# Patient Record
Sex: Female | Born: 1965
Health system: Southern US, Community
[De-identification: ages and names within clinical notes are randomized; demographics above are authoritative.]

## PROBLEM LIST (undated history)

## (undated) DIAGNOSIS — E049 Nontoxic goiter, unspecified: Secondary | ICD-10-CM

## (undated) DIAGNOSIS — N951 Menopausal and female climacteric states: Secondary | ICD-10-CM

## (undated) DIAGNOSIS — R87629 Unspecified abnormal cytological findings in specimens from vagina: Secondary | ICD-10-CM

## (undated) DIAGNOSIS — R718 Other abnormality of red blood cells: Secondary | ICD-10-CM

## (undated) DIAGNOSIS — I10 Essential (primary) hypertension: Secondary | ICD-10-CM

## (undated) DIAGNOSIS — E559 Vitamin D deficiency, unspecified: Secondary | ICD-10-CM

## (undated) DIAGNOSIS — E785 Hyperlipidemia, unspecified: Secondary | ICD-10-CM

## (undated) HISTORY — DX: Vitamin D deficiency, unspecified: E55.9

## (undated) HISTORY — DX: Essential (primary) hypertension: I10

## (undated) HISTORY — DX: Menopausal and female climacteric states: N95.1

## (undated) HISTORY — DX: Unspecified abnormal cytological findings in specimens from vagina: R87.629

## (undated) HISTORY — DX: Hyperlipidemia, unspecified: E78.5

## (undated) HISTORY — DX: Nontoxic goiter, unspecified: E04.9

## (undated) HISTORY — DX: Other abnormality of red blood cells: R71.8

## (undated) HISTORY — PX: APPENDECTOMY: SHX54

## (undated) HISTORY — PX: ANKLE SURGERY: SHX546

---

## 1998-10-30 ENCOUNTER — Encounter: Payer: Self-pay | Admitting: Specialist

## 1998-10-30 ENCOUNTER — Inpatient Hospital Stay (HOSPITAL_COMMUNITY): Admission: EM | Admit: 1998-10-30 | Discharge: 1998-10-31 | Payer: Self-pay | Admitting: Emergency Medicine

## 1998-10-30 ENCOUNTER — Encounter: Payer: Self-pay | Admitting: Emergency Medicine

## 2002-07-02 HISTORY — PX: HYSTEROSCOPY W/ ENDOMETRIAL ABLATION: SUR665

## 2002-07-02 HISTORY — PX: OTHER SURGICAL HISTORY: SHX169

## 2002-10-26 ENCOUNTER — Encounter: Payer: Self-pay | Admitting: Obstetrics & Gynecology

## 2002-10-26 ENCOUNTER — Ambulatory Visit (HOSPITAL_COMMUNITY): Admission: RE | Admit: 2002-10-26 | Discharge: 2002-10-26 | Payer: Self-pay | Admitting: Obstetrics & Gynecology

## 2002-11-06 ENCOUNTER — Other Ambulatory Visit: Admission: RE | Admit: 2002-11-06 | Discharge: 2002-11-06 | Payer: Self-pay | Admitting: Obstetrics & Gynecology

## 2002-11-27 ENCOUNTER — Ambulatory Visit (HOSPITAL_COMMUNITY): Admission: RE | Admit: 2002-11-27 | Discharge: 2002-11-27 | Payer: Self-pay | Admitting: Obstetrics & Gynecology

## 2007-07-25 ENCOUNTER — Other Ambulatory Visit: Admission: RE | Admit: 2007-07-25 | Discharge: 2007-07-25 | Payer: Self-pay | Admitting: Obstetrics & Gynecology

## 2007-08-25 ENCOUNTER — Ambulatory Visit (HOSPITAL_COMMUNITY): Admission: RE | Admit: 2007-08-25 | Discharge: 2007-08-25 | Payer: Self-pay | Admitting: Obstetrics & Gynecology

## 2007-09-10 ENCOUNTER — Encounter: Admission: RE | Admit: 2007-09-10 | Discharge: 2007-09-10 | Payer: Self-pay | Admitting: Obstetrics & Gynecology

## 2008-09-14 ENCOUNTER — Other Ambulatory Visit: Admission: RE | Admit: 2008-09-14 | Discharge: 2008-09-14 | Payer: Self-pay | Admitting: Obstetrics & Gynecology

## 2008-09-14 ENCOUNTER — Encounter: Admission: RE | Admit: 2008-09-14 | Discharge: 2008-09-14 | Payer: Self-pay | Admitting: Obstetrics & Gynecology

## 2009-12-22 ENCOUNTER — Ambulatory Visit (HOSPITAL_COMMUNITY): Admission: RE | Admit: 2009-12-22 | Discharge: 2009-12-22 | Payer: Self-pay | Admitting: Obstetrics & Gynecology

## 2009-12-22 ENCOUNTER — Other Ambulatory Visit: Admission: RE | Admit: 2009-12-22 | Discharge: 2009-12-22 | Payer: Self-pay | Admitting: Obstetrics & Gynecology

## 2010-11-17 NOTE — Op Note (Signed)
   NAME:  Sherri Jackson, Sherri Jackson                      ACCOUNT NO.:  [id number]   MEDICAL RECORD NO.:  [id number]                   PATIENT TYPE:  AMB   LOCATION:  DAY                                  FACILITY:  [facility]   PHYSICIAN:  Lazaro Arms, M.D.                DATE OF BIRTH:  1966/06/04   DATE OF PROCEDURE:  11/27/2002  DATE OF DISCHARGE:                                 OPERATIVE REPORT   PREOPERATIVE DIAGNOSES:  1. Hypermenorrhea, dysmenorrhea.  2. High grade squamous intraepithelial lesion on Pap.   POSTOPERATIVE DIAGNOSES:  1. Hypermenorrhea, dysmenorrhea.  2. High grade squamous intraepithelial lesion on Pap.   PROCEDURE:  1. Hysteroscopy D&C with endometrial ablation.  2. Laser conization of cervix.   SURGEON:  Lazaro Arms, M.D.   ANESTHESIA:  General endotracheal anesthesia.   FINDINGS:  The patient had a normal endometrium, low endometrial fragments,  but no polyps, no fibroids seen.  A cervical colposcopy had been performed  in the office with biopsy but the lesion was in the cervical canal and we  decided to do a cold knife conization with ablation of the entire  endocervical canal.   DESCRIPTION OF OPERATION:  The patient was taken to the operating room and  placed in the supine position and underwent general endotracheal anesthesia;  placed in dorsal lithotomy position; prepped and draped in the usual sterile  fashion for vaginal surgery.  The bladder was drained.  Speculum was placed.  Cervix was grasped.  Pericervical block was placed.  Cervix was dilated  serially.  It sounded to 8 cm.  The hysteroscope was placed.  The above  noted findings seen.  Pictures were taken.  Curettage was performed; good  uterine cry in all areas.  Endometrial ablation using ThermaChoice balloon  was performed without difficulty.  Total therapy time of 8 minutes 52  seconds.  The balloon was removed.   The holmium laser was then used and a conization of the cervix was  performed.  The lesion I was concerned about was in the endocervical canal  and hemostasis was achieved with electrocautery and the holmium laser and  Lugol's solution.  The patient tolerated the procedure well.  She  experienced 100 cc of blood loss, taken to recovery in good stable  condition.  All counts were correct.                                               Lazaro Arms, M.D.   Loraine Maple  D:  11/27/2002  T:  11/27/2002  Job:  [id number]

## 2011-01-15 ENCOUNTER — Other Ambulatory Visit: Payer: Self-pay | Admitting: Obstetrics & Gynecology

## 2011-01-15 DIAGNOSIS — Z139 Encounter for screening, unspecified: Secondary | ICD-10-CM

## 2011-01-15 DIAGNOSIS — N63 Unspecified lump in unspecified breast: Secondary | ICD-10-CM

## 2011-01-22 ENCOUNTER — Other Ambulatory Visit (HOSPITAL_COMMUNITY)
Admission: RE | Admit: 2011-01-22 | Discharge: 2011-01-22 | Disposition: A | Discharge status: Home / Self Care | Payer: Federal, State, Local not specified - PPO | Source: Ambulatory Visit | Attending: Obstetrics & Gynecology | Admitting: Obstetrics & Gynecology

## 2011-01-22 ENCOUNTER — Other Ambulatory Visit: Payer: Self-pay | Admitting: Obstetrics & Gynecology

## 2011-01-22 DIAGNOSIS — Z01419 Encounter for gynecological examination (general) (routine) without abnormal findings: Secondary | ICD-10-CM | POA: Insufficient documentation

## 2011-01-23 ENCOUNTER — Other Ambulatory Visit: Payer: Self-pay | Admitting: Obstetrics & Gynecology

## 2011-01-23 ENCOUNTER — Ambulatory Visit
Admission: RE | Admit: 2011-01-23 | Discharge: 2011-01-23 | Disposition: A | Discharge status: Home / Self Care | Payer: Federal, State, Local not specified - PPO | Source: Ambulatory Visit | Attending: Obstetrics & Gynecology | Admitting: Obstetrics & Gynecology

## 2011-01-23 DIAGNOSIS — N63 Unspecified lump in unspecified breast: Secondary | ICD-10-CM

## 2011-02-26 ENCOUNTER — Ambulatory Visit (HOSPITAL_COMMUNITY): Payer: Self-pay

## 2012-01-16 ENCOUNTER — Other Ambulatory Visit: Payer: Self-pay | Admitting: Obstetrics & Gynecology

## 2012-01-16 DIAGNOSIS — Z1231 Encounter for screening mammogram for malignant neoplasm of breast: Secondary | ICD-10-CM

## 2012-04-10 ENCOUNTER — Ambulatory Visit: Payer: Federal, State, Local not specified - PPO

## 2012-04-14 ENCOUNTER — Other Ambulatory Visit (HOSPITAL_COMMUNITY)
Admission: RE | Admit: 2012-04-14 | Discharge: 2012-04-14 | Disposition: A | Discharge status: Home / Self Care | Payer: Federal, State, Local not specified - PPO | Source: Ambulatory Visit | Attending: Obstetrics & Gynecology | Admitting: Obstetrics & Gynecology

## 2012-04-14 ENCOUNTER — Ambulatory Visit
Admission: RE | Admit: 2012-04-14 | Discharge: 2012-04-14 | Disposition: A | Discharge status: Home / Self Care | Payer: Federal, State, Local not specified - PPO | Source: Ambulatory Visit | Attending: Obstetrics & Gynecology | Admitting: Obstetrics & Gynecology

## 2012-04-14 ENCOUNTER — Other Ambulatory Visit: Payer: Self-pay | Admitting: Obstetrics & Gynecology

## 2012-04-14 DIAGNOSIS — Z01419 Encounter for gynecological examination (general) (routine) without abnormal findings: Secondary | ICD-10-CM | POA: Insufficient documentation

## 2012-04-14 DIAGNOSIS — Z1231 Encounter for screening mammogram for malignant neoplasm of breast: Secondary | ICD-10-CM

## 2013-06-08 ENCOUNTER — Other Ambulatory Visit: Payer: Self-pay

## 2013-06-08 DIAGNOSIS — Z1231 Encounter for screening mammogram for malignant neoplasm of breast: Secondary | ICD-10-CM

## 2013-07-06 ENCOUNTER — Ambulatory Visit
Admission: RE | Admit: 2013-07-06 | Discharge: 2013-07-06 | Disposition: A | Discharge status: Home / Self Care | Payer: Federal, State, Local not specified - PPO | Source: Ambulatory Visit

## 2013-07-06 DIAGNOSIS — Z1231 Encounter for screening mammogram for malignant neoplasm of breast: Secondary | ICD-10-CM

## 2013-12-01 ENCOUNTER — Ambulatory Visit (INDEPENDENT_AMBULATORY_CARE_PROVIDER_SITE_OTHER): Payer: Federal, State, Local not specified - PPO | Admitting: Adult Health

## 2013-12-01 ENCOUNTER — Other Ambulatory Visit: Payer: Federal, State, Local not specified - PPO

## 2013-12-01 ENCOUNTER — Encounter: Payer: Self-pay | Admitting: Adult Health

## 2013-12-01 ENCOUNTER — Other Ambulatory Visit (HOSPITAL_COMMUNITY)
Admission: RE | Admit: 2013-12-01 | Discharge: 2013-12-01 | Disposition: A | Discharge status: Home / Self Care | Payer: Federal, State, Local not specified - PPO | Source: Ambulatory Visit | Attending: Adult Health | Admitting: Adult Health

## 2013-12-01 VITALS — BP 106/60 | HR 74 | Ht 59.0 in | Wt 87.0 lb

## 2013-12-01 DIAGNOSIS — Z01419 Encounter for gynecological examination (general) (routine) without abnormal findings: Secondary | ICD-10-CM | POA: Insufficient documentation

## 2013-12-01 DIAGNOSIS — Z1151 Encounter for screening for human papillomavirus (HPV): Secondary | ICD-10-CM | POA: Insufficient documentation

## 2013-12-01 DIAGNOSIS — E049 Nontoxic goiter, unspecified: Secondary | ICD-10-CM | POA: Insufficient documentation

## 2013-12-01 DIAGNOSIS — Z1212 Encounter for screening for malignant neoplasm of rectum: Secondary | ICD-10-CM

## 2013-12-01 DIAGNOSIS — N951 Menopausal and female climacteric states: Secondary | ICD-10-CM

## 2013-12-01 HISTORY — DX: Menopausal and female climacteric states: N95.1

## 2013-12-01 HISTORY — DX: Nontoxic goiter, unspecified: E04.9

## 2013-12-01 LAB — HEMOCCULT GUIAC POC 1CARD (OFFICE): FECAL OCCULT BLD: NEGATIVE

## 2013-12-01 NOTE — Patient Instructions (Signed)
Menopause Menopause is the normal time of life when menstrual periods stop completely. Menopause is complete when you have missed 12 consecutive menstrual periods. It usually occurs between the ages of 62 years and 17 years. Very rarely does a woman develop menopause before the age of 79 years. At menopause, your ovaries stop producing the female hormones estrogen and progesterone. This can cause undesirable symptoms and also affect your health. Sometimes the symptoms may occur 4 5 years before the menopause begins. There is no relationship between menopause and: Oral contraceptives. Number of children you had. Race. The age your menstrual periods started (menarche). Heavy smokers and very thin women may develop menopause earlier in life. CAUSES The ovaries stop producing the female hormones estrogen and progesterone. Other causes include: Surgery to remove both ovaries. The ovaries stop functioning for no known reason. Tumors of the pituitary gland in the brain. Medical disease that affects the ovaries and hormone production. Radiation treatment to the abdomen or pelvis. Chemotherapy that affects the ovaries. SYMPTOMS  Hot flashes. Night sweats. Decrease in sex drive. Vaginal dryness and thinning of the vagina causing painful intercourse. Dryness of the skin and developing wrinkles. Headaches. Tiredness. Irritability. Memory problems. Weight gain. Bladder infections. Hair growth of the face and chest. Infertility. More serious symptoms include: Loss of bone (osteoporosis) causing breaks (fractures). Depression. Hardening and narrowing of the arteries (atherosclerosis) causing heart attacks and strokes. DIAGNOSIS  When the menstrual periods have stopped for 12 straight months. Physical exam. Hormone studies of the blood. TREATMENT  There are many treatment choices and nearly as many questions about them. The decisions to treat or not to treat menopausal changes is an individual  choice made with your health care provider. Your health care provider can discuss the treatments with you. Together, you can decide which treatment will work best for you. Your treatment choices may include:  Hormone therapy (estrogen and progesterone). Non-hormonal medicines. Treating the individual symptoms with medicine (for example antidepressants for depression). Herbal medicines that may help specific symptoms. Counseling by a psychiatrist or psychologist. Group therapy. Lifestyle changes including: Eating healthy. Regular exercise. Limiting caffeine and alcohol. Stress management and meditation. No treatment. HOME CARE INSTRUCTIONS  Take the medicine your health care provider gives you as directed. Get plenty of sleep and rest. Exercise regularly. Eat a diet that contains calcium (good for the bones) and soy products (acts like estrogen hormone). Avoid alcoholic beverages. Do not smoke. If you have hot flashes, dress in layers. Take supplements, calcium, and vitamin D to strengthen bones. You can use over-the-counter lubricants or moisturizers for vaginal dryness. Group therapy is sometimes very helpful. Acupuncture may be helpful in some cases. SEEK MEDICAL CARE IF:  You are not sure you are in menopause. You are having menopausal symptoms and need advice and treatment. You are still having menstrual periods after age 54 years. You have pain with intercourse. Menopause is complete (no menstrual period for 12 months) and you develop vaginal bleeding. You need a referral to a specialist (gynecologist, psychiatrist, or psychologist) for treatment. SEEK IMMEDIATE MEDICAL CARE IF:  You have severe depression. You have excessive vaginal bleeding. You fell and think you have a broken bone. You have pain when you urinate. You develop leg or chest pain. You have a fast pounding heart beat (palpitations). You have severe headaches. You develop vision problems. You feel a lump  in your breast. You have abdominal pain or severe indigestion. Document Released: 09/08/2003 Document Revised: 02/18/2013 Document Reviewed: 01/15/2013 [employer]Care  Patient Information 2014 [place]. Perimenopause Perimenopause is the time when your body begins to move into the menopause (no menstrual period for 12 straight months). It is a natural process. Perimenopause can begin 2 8 years before the menopause and usually lasts for 1 year after the menopause. During this time, your ovaries may or may not produce an egg. The ovaries vary in their production of estrogen and progesterone hormones each month. This can cause irregular menstrual periods, difficulty getting pregnant, vaginal bleeding between periods, and uncomfortable symptoms. CAUSES  Irregular production of the ovarian hormones, estrogen and progesterone, and not ovulating every month.  Other causes include:  Tumor of the pituitary gland in the brain.  Medical disease that affects the ovaries.  Radiation treatment.  Chemotherapy.  Unknown causes.  Heavy smoking and excessive alcohol intake can bring on perimenopause sooner. SIGNS AND SYMPTOMS   Hot flashes.  Night sweats.  Irregular menstrual periods.  Decreased sex drive.  Vaginal dryness.  Headaches.  Mood swings.  Depression.  Memory problems.  Irritability.  Tiredness.  Weight gain.  Trouble getting pregnant.  The beginning of losing bone cells (osteoporosis).  The beginning of hardening of the arteries (atherosclerosis). DIAGNOSIS  Your health care provider will make a diagnosis by analyzing your age, menstrual history, and symptoms. He or she will do a physical exam and note any changes in your body, especially your female organs. Female hormone tests may or may not be helpful depending on the amount of female hormones you produce and when you produce them. However, other hormone tests may be helpful to rule out other problems. TREATMENT   In some cases, no treatment is needed. The decision on whether treatment is necessary during the perimenopause should be made by you and your health care provider based on how the symptoms are affecting you and your lifestyle. Various treatments are available, such as:  Treating individual symptoms with a specific medicine for that symptom.  Herbal medicines that can help specific symptoms.  Counseling.  Group therapy. HOME CARE INSTRUCTIONS   Keep track of your menstrual periods (when they occur, how heavy they are, how long between periods, and how long they last) as well as your symptoms and when they started.  Only take over-the-counter or prescription medicines as directed by your health care provider.  Sleep and rest.  Exercise.  Eat a diet that contains calcium (good for your bones) and soy (acts like the estrogen hormone).  Do not smoke.  Avoid alcoholic beverages.  Take vitamin supplements as recommended by your health care provider. Taking vitamin E may help in certain cases.  Take calcium and vitamin D supplements to help prevent bone loss.  Group therapy is sometimes helpful.  Acupuncture may help in some cases. SEEK MEDICAL CARE IF:   You have questions about any symptoms you are having.  You need a referral to a specialist (gynecologist, psychiatrist, or psychologist). SEEK IMMEDIATE MEDICAL CARE IF:   You have vaginal bleeding.  Your period lasts longer than 8 days.  Your periods are recurring sooner than 21 days.  You have bleeding after intercourse.  You have severe depression.  You have pain when you urinate.  You have severe headaches.  You have vision problems. Document Released: 07/26/2004 Document Revised: 04/08/2013 Document Reviewed: 01/15/2013 [facility] Patient Information 2014 [place], [place]. Physical in 1 year Mammogram yearly  Thyroid US 6/3 at 4:15 pm be there at 4 pm Colonoscopy at 5

## 2013-12-01 NOTE — Progress Notes (Signed)
Patient ID: Sherri Jackson, female   DOB: 1965-08-10, 48 y.o.   MRN: [id number] History of Present Illness: Sherri Jackson is a 48 year old white female, married in for a pap and physical.   Current Medications, Allergies, Past Medical History, Past Surgical History, Family History and Social History were reviewed in [employer] record.     Review of Systems: Patient denies any headaches, blurred vision, shortness of breath, chest pain, abdominal pain, problems with bowel movements, urination, or intercourse. No joint pain, she is having moodiness,and she is skipping periods,no hot flashes during day but hot at night.    Physical Exam:BP 106/60  Pulse 74  Ht 4\' 11"  (1.499 m)  Wt 87 lb (39.463 kg)  BMI 17.56 kg/m2  LMP 11/15/2013 General:  Well developed, well nourished, no acute distress Skin:  Warm and dry Neck:  Midline trachea,  Thyroid enlarged R>L Lungs; Clear to auscultation bilaterally Breast:  No dominant palpable mass, retraction, or nipple discharge Cardiovascular: Regular rate and rhythm Abdomen:  Soft, non tender, no hepatosplenomegaly Pelvic:  External genitalia is normal in appearance.  The vagina is normal in appearance.  The cervix is nulliparous, Pap with HPV performed.  Uterus is felt to be normal size, shape, and contour.  No                adnexal masses or tenderness noted. Rectal: Good sphincter tone, no polyps, or hemorrhoids felt.  Hemoccult negative. Extremities:  No swelling or varicosities noted Psych:  Alert and cooperative,seems happy Discussed menopause and some treatment options.  Impression: Yearly gyn exam Peri menopause Enlarged thyroid    Plan: Check CBC,CMP,TSH,FSH and lipids Thyroid US 6/3 at 4:15 pm at [facility] Physical in 1 year Mammogram yearly  Colonoscopy at 48 (had one in 2008 for bleeding due to hemorrhoids) Review handout on peri menopause and menopause  Will talk when labs back or can look at my chart

## 2013-12-02 ENCOUNTER — Ambulatory Visit (HOSPITAL_COMMUNITY)
Admission: RE | Admit: 2013-12-02 | Discharge: 2013-12-02 | Disposition: A | Discharge status: Home / Self Care | Payer: Federal, State, Local not specified - PPO | Source: Ambulatory Visit | Attending: Adult Health | Admitting: Adult Health

## 2013-12-02 DIAGNOSIS — E049 Nontoxic goiter, unspecified: Secondary | ICD-10-CM

## 2013-12-02 LAB — LIPID PANEL
CHOL/HDL RATIO: 3.1 ratio
Cholesterol: 178 mg/dL (ref 0–200)
HDL: 57 mg/dL (ref >39)
LDL CALC: 108 mg/dL — AB (ref 0–99)
TRIGLYCERIDES: 66 mg/dL (ref <150)
VLDL: 13 mg/dL (ref 0–40)

## 2013-12-02 LAB — COMPREHENSIVE METABOLIC PANEL
ALBUMIN: 4.5 g/dL (ref 3.5–5.2)
ALK PHOS: 45 U/L (ref 39–117)
ALT: 12 U/L (ref 0–35)
AST: 16 U/L (ref 0–37)
BUN: 10 mg/dL (ref 6–23)
CO2: 25 mEq/L (ref 19–32)
Calcium: 9.8 mg/dL (ref 8.4–10.5)
Chloride: 106 mEq/L (ref 96–112)
Creat: 0.49 mg/dL — ABNORMAL LOW (ref 0.50–1.10)
Glucose, Bld: 83 mg/dL (ref 70–99)
POTASSIUM: 4.8 meq/L (ref 3.5–5.3)
SODIUM: 139 meq/L (ref 135–145)
TOTAL PROTEIN: 6.7 g/dL (ref 6.0–8.3)
Total Bilirubin: 0.4 mg/dL (ref 0.2–1.2)

## 2013-12-02 LAB — FOLLICLE STIMULATING HORMONE: FSH: 7.6 m[IU]/mL

## 2013-12-02 LAB — CBC
HCT: 39.5 % (ref 36.0–46.0)
Hemoglobin: 13.7 g/dL (ref 12.0–15.0)
MCH: 34 pg (ref 26.0–34.0)
MCHC: 34.7 g/dL (ref 30.0–36.0)
MCV: 98 fL (ref 78.0–100.0)
Platelets: 510 10*3/uL — ABNORMAL HIGH (ref 150–400)
RBC: 4.03 MIL/uL (ref 3.87–5.11)
RDW: 12.8 % (ref 11.5–15.5)
WBC: 9.4 10*3/uL (ref 4.0–10.5)

## 2013-12-02 LAB — TSH: TSH: 0.633 u[IU]/mL (ref 0.350–4.500)

## 2013-12-04 LAB — CYTOLOGY - PAP

## 2013-12-07 ENCOUNTER — Telehealth: Payer: Self-pay | Admitting: Adult Health

## 2013-12-07 NOTE — Telephone Encounter (Signed)
Pt aware of labs,thyroid US and Pap

## 2013-12-10 ENCOUNTER — Encounter: Payer: Self-pay | Admitting: Adult Health

## 2014-07-09 ENCOUNTER — Other Ambulatory Visit: Payer: Self-pay

## 2014-07-09 DIAGNOSIS — Z1231 Encounter for screening mammogram for malignant neoplasm of breast: Secondary | ICD-10-CM

## 2014-08-04 ENCOUNTER — Ambulatory Visit
Admission: RE | Admit: 2014-08-04 | Discharge: 2014-08-04 | Disposition: A | Discharge status: Home / Self Care | Payer: Federal, State, Local not specified - PPO | Source: Ambulatory Visit

## 2014-08-04 ENCOUNTER — Other Ambulatory Visit: Payer: Self-pay

## 2014-08-04 DIAGNOSIS — Z1231 Encounter for screening mammogram for malignant neoplasm of breast: Secondary | ICD-10-CM

## 2015-07-06 ENCOUNTER — Emergency Department (INDEPENDENT_AMBULATORY_CARE_PROVIDER_SITE_OTHER): Payer: Federal, State, Local not specified - PPO

## 2015-07-06 ENCOUNTER — Emergency Department (INDEPENDENT_AMBULATORY_CARE_PROVIDER_SITE_OTHER)
Admission: EM | Admit: 2015-07-06 | Discharge: 2015-07-06 | Disposition: A | Discharge status: Home / Self Care | Payer: Federal, State, Local not specified - PPO | Source: Home / Self Care | Attending: Family Medicine | Admitting: Family Medicine

## 2015-07-06 ENCOUNTER — Encounter (HOSPITAL_COMMUNITY): Payer: Self-pay | Admitting: Emergency Medicine

## 2015-07-06 DIAGNOSIS — M542 Cervicalgia: Secondary | ICD-10-CM

## 2015-07-06 NOTE — Discharge Instructions (Signed)
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
## 2015-08-29 ENCOUNTER — Other Ambulatory Visit: Payer: Self-pay

## 2015-08-29 DIAGNOSIS — Z1231 Encounter for screening mammogram for malignant neoplasm of breast: Secondary | ICD-10-CM

## 2015-09-16 ENCOUNTER — Ambulatory Visit
Admission: RE | Admit: 2015-09-16 | Discharge: 2015-09-16 | Disposition: A | Discharge status: Home / Self Care | Payer: Federal, State, Local not specified - PPO | Source: Ambulatory Visit

## 2015-09-16 DIAGNOSIS — Z1231 Encounter for screening mammogram for malignant neoplasm of breast: Secondary | ICD-10-CM

## 2015-09-20 ENCOUNTER — Other Ambulatory Visit: Payer: Self-pay | Admitting: Obstetrics & Gynecology

## 2015-09-20 DIAGNOSIS — R928 Other abnormal and inconclusive findings on diagnostic imaging of breast: Secondary | ICD-10-CM

## 2015-09-27 ENCOUNTER — Ambulatory Visit
Admission: RE | Admit: 2015-09-27 | Discharge: 2015-09-27 | Disposition: A | Discharge status: Home / Self Care | Payer: Federal, State, Local not specified - PPO | Source: Ambulatory Visit | Attending: Obstetrics & Gynecology | Admitting: Obstetrics & Gynecology

## 2015-09-27 DIAGNOSIS — R928 Other abnormal and inconclusive findings on diagnostic imaging of breast: Secondary | ICD-10-CM

## 2016-03-19 ENCOUNTER — Encounter: Payer: Self-pay | Admitting: Adult Health

## 2016-03-19 ENCOUNTER — Ambulatory Visit (INDEPENDENT_AMBULATORY_CARE_PROVIDER_SITE_OTHER): Payer: Federal, State, Local not specified - PPO | Admitting: Adult Health

## 2016-03-19 VITALS — BP 122/70 | HR 66 | Ht 59.25 in | Wt 88.0 lb

## 2016-03-19 DIAGNOSIS — F4329 Adjustment disorder with other symptoms: Secondary | ICD-10-CM | POA: Diagnosis not present

## 2016-03-19 DIAGNOSIS — R232 Flushing: Secondary | ICD-10-CM

## 2016-03-19 DIAGNOSIS — N951 Menopausal and female climacteric states: Secondary | ICD-10-CM

## 2016-03-19 DIAGNOSIS — K649 Unspecified hemorrhoids: Secondary | ICD-10-CM

## 2016-03-19 DIAGNOSIS — Z01411 Encounter for gynecological examination (general) (routine) with abnormal findings: Secondary | ICD-10-CM | POA: Diagnosis not present

## 2016-03-19 DIAGNOSIS — Z1211 Encounter for screening for malignant neoplasm of colon: Secondary | ICD-10-CM | POA: Diagnosis not present

## 2016-03-19 DIAGNOSIS — Z780 Asymptomatic menopausal state: Secondary | ICD-10-CM | POA: Diagnosis not present

## 2016-03-19 DIAGNOSIS — Z01419 Encounter for gynecological examination (general) (routine) without abnormal findings: Secondary | ICD-10-CM

## 2016-03-19 LAB — HEMOCCULT GUIAC POC 1CARD (OFFICE): FECAL OCCULT BLD: NEGATIVE

## 2016-03-19 MED ORDER — LORAZEPAM 0.5 MG PO TABS: ORAL_TABLET | ORAL | 0 refills | Status: DC

## 2016-03-19 NOTE — Patient Instructions (Addendum)
Pap and physical in 1 year Mammogram yearly Colonoscopy at 33 See dermatologist

## 2016-03-19 NOTE — Progress Notes (Signed)
Patient ID: Sherri Jackson, female   DOB: 01-20-1966, 50 y.o.   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   Current Medications, Allergies, Past Medical History, Past Surgical History, Family History and Social History were reviewed in [employer] record.     Review of Systems: Patient denies any headaches, hearing loss, fatigue, blurred vision, shortness of breath, chest pain, abdominal pain, problems with urination, or intercourse. No joint pain or mood swings. See HPI for positives.   Physical Exam:BP 122/70 (BP Location: Left Arm, Patient Position: Sitting, Cuff Size: Normal)   Pulse 66   Ht 4' 11.25" (1.505 m)   Wt 88 lb (39.9 kg)   LMP 11/15/2013   BMI 17.62 kg/m  General:  Well developed, well nourished, no acute distress Skin:  Warm and dry Neck:  Midline trachea, normal thyroid, good ROM, no lymphadenopathy Lungs; Clear to auscultation bilaterally Breast:  No dominant palpable mass, retraction, or nipple discharge Cardiovascular: Regular rate and rhythm Back: has several moles in places of friction but not of concern,see dermatologist  Abdomen:  Soft, non tender, no hepatosplenomegaly Pelvic:  External genitalia is normal in appearance, no lesions.  The vagina is normal in appearance. Urethra has no lesions or masses. The cervix is smooth.  Uterus is felt to be normal size, shape, and contour.  No adnexal masses or tenderness noted.Bladder is non tender, no masses felt. Rectal: Good sphincter tone, no polyps, + hemorrhoids felt.   Hemoccult negative. Extremities/musculoskeletal:  No swelling or varicosities noted, no clubbing or cyanosis Psych:  No mood changes, alert and cooperative,seems happy Discussed if has any bleeding let me know, and she says does not need meds for hot flashes, but would take something to help when feeling really stressed.   Impression: 1. Well woman exam with routine gynecological exam   2. Hemorrhoids, unspecified hemorrhoid type   3. Hot flashes   4. Postmenopausal   5. Stress and adjustment reaction       Plan: Check CBC,CMP,TSH and lipids,A1c and vitamin D Rx ativan 0.5 mg #30 take 1 every 12 hours prn, no refills  Pap and physical in 1 year Mammogram yearly  Colonoscopy at 55 See dermatologist

## 2016-03-20 ENCOUNTER — Encounter: Payer: Self-pay | Admitting: Adult Health

## 2016-03-20 ENCOUNTER — Telehealth: Payer: Self-pay | Admitting: Adult Health

## 2016-03-20 DIAGNOSIS — R718 Other abnormality of red blood cells: Secondary | ICD-10-CM | POA: Insufficient documentation

## 2016-03-20 DIAGNOSIS — E559 Vitamin D deficiency, unspecified: Secondary | ICD-10-CM

## 2016-03-20 HISTORY — DX: Vitamin D deficiency, unspecified: E55.9

## 2016-03-20 HISTORY — DX: Other abnormality of red blood cells: R71.8

## 2016-03-20 LAB — COMPREHENSIVE METABOLIC PANEL
A/G RATIO: 1.9 (ref 1.2–2.2)
ALBUMIN: 4.6 g/dL (ref 3.5–5.5)
ALK PHOS: 56 IU/L (ref 39–117)
ALT: 9 IU/L (ref 0–32)
AST: 17 IU/L (ref 0–40)
BILIRUBIN TOTAL: 0.2 mg/dL (ref 0.0–1.2)
BUN/Creatinine Ratio: 14 (ref 9–23)
BUN: 8 mg/dL (ref 6–24)
CHLORIDE: 101 mmol/L (ref 96–106)
CO2: 22 mmol/L (ref 18–29)
Calcium: 10.3 mg/dL — ABNORMAL HIGH (ref 8.7–10.2)
Creatinine, Ser: 0.57 mg/dL (ref 0.57–1.00)
GFR calc non Af Amer: 109 mL/min/{1.73_m2} (ref >59)
GFR, EST AFRICAN AMERICAN: 126 mL/min/{1.73_m2} (ref >59)
Globulin, Total: 2.4 g/dL (ref 1.5–4.5)
Glucose: 79 mg/dL (ref 65–99)
POTASSIUM: 5 mmol/L (ref 3.5–5.2)
Sodium: 142 mmol/L (ref 134–144)
Total Protein: 7 g/dL (ref 6.0–8.5)

## 2016-03-20 LAB — LIPID PANEL
CHOL/HDL RATIO: 2.4 ratio (ref 0.0–4.4)
CHOLESTEROL TOTAL: 196 mg/dL (ref 100–199)
HDL: 83 mg/dL (ref >39)
LDL Calculated: 95 mg/dL (ref 0–99)
TRIGLYCERIDES: 92 mg/dL (ref 0–149)
VLDL Cholesterol Cal: 18 mg/dL (ref 5–40)

## 2016-03-20 LAB — CBC
HEMATOCRIT: 40.7 % (ref 34.0–46.6)
HEMOGLOBIN: 13.8 g/dL (ref 11.1–15.9)
MCH: 34.4 pg — AB (ref 26.6–33.0)
MCHC: 33.9 g/dL (ref 31.5–35.7)
MCV: 102 fL — AB (ref 79–97)
Platelets: 523 10*3/uL — ABNORMAL HIGH (ref 150–379)
RBC: 4.01 x10E6/uL (ref 3.77–5.28)
RDW: 13.5 % (ref 12.3–15.4)
WBC: 11.1 10*3/uL — ABNORMAL HIGH (ref 3.4–10.8)

## 2016-03-20 LAB — TSH: TSH: 0.605 u[IU]/mL (ref 0.450–4.500)

## 2016-03-20 LAB — SPECIMEN STATUS REPORT

## 2016-03-20 LAB — VITAMIN D 25 HYDROXY (VIT D DEFICIENCY, FRACTURES): VIT D 25 HYDROXY: 23.1 ng/mL — AB (ref 30.0–100.0)

## 2016-03-20 LAB — HEMOGLOBIN A1C
Est. average glucose Bld gHb Est-mCnc: 103 mg/dL
Hgb A1c MFr Bld: 5.2 % (ref 4.8–5.6)

## 2016-03-20 MED ORDER — CHOLECALCIFEROL 125 MCG (5000 UT) PO CAPS: 5000.0000 [IU] | ORAL_CAPSULE | Freq: Every day | ORAL | Status: DC

## 2016-03-20 NOTE — Telephone Encounter (Signed)
Left message that MCV was elevated will check B12 and folate, and that vitamin D level low, so take vitamin D 3 5000 IU daily

## 2016-03-21 ENCOUNTER — Telehealth: Payer: Self-pay | Admitting: Adult Health

## 2016-03-21 LAB — SPECIMEN STATUS REPORT

## 2016-03-21 LAB — B12 AND FOLATE PANEL
FOLATE: 10.4 ng/mL (ref >3.0)
VITAMIN B 12: 327 pg/mL (ref 211–946)

## 2016-03-21 NOTE — Telephone Encounter (Signed)
Pt aware labs WNL, can still try B 12 OTC daily po to see if helps with fatigue feeling

## 2016-06-27 ENCOUNTER — Other Ambulatory Visit: Payer: Self-pay | Admitting: Adult Health

## 2016-06-27 MED ORDER — CHOLECALCIFEROL 125 MCG (5000 UT) PO CAPS: 5000.0000 [IU] | ORAL_CAPSULE | Freq: Every day | ORAL | Status: DC

## 2016-06-27 MED ORDER — LORAZEPAM 0.5 MG PO TABS: ORAL_TABLET | ORAL | 0 refills | Status: DC

## 2016-06-27 NOTE — Telephone Encounter (Signed)
Will refill ativan and vitam in D

## 2016-08-16 DIAGNOSIS — L298 Other pruritus: Secondary | ICD-10-CM | POA: Diagnosis not present

## 2016-08-16 DIAGNOSIS — D225 Melanocytic nevi of trunk: Secondary | ICD-10-CM | POA: Diagnosis not present

## 2016-08-16 DIAGNOSIS — L820 Inflamed seborrheic keratosis: Secondary | ICD-10-CM | POA: Diagnosis not present

## 2016-08-22 DIAGNOSIS — K080 Exfoliation of teeth due to systemic causes: Secondary | ICD-10-CM | POA: Diagnosis not present

## 2016-10-12 ENCOUNTER — Other Ambulatory Visit: Payer: Self-pay | Admitting: Adult Health

## 2016-10-12 ENCOUNTER — Encounter: Payer: Self-pay | Admitting: Adult Health

## 2016-10-12 DIAGNOSIS — Z1231 Encounter for screening mammogram for malignant neoplasm of breast: Secondary | ICD-10-CM

## 2016-10-15 ENCOUNTER — Other Ambulatory Visit: Payer: Self-pay | Admitting: Adult Health

## 2016-10-15 MED ORDER — LORAZEPAM 0.5 MG PO TABS: ORAL_TABLET | ORAL | 0 refills | Status: DC

## 2016-10-15 NOTE — Telephone Encounter (Signed)
Refilled ativan

## 2016-11-01 ENCOUNTER — Ambulatory Visit
Admission: RE | Admit: 2016-11-01 | Discharge: 2016-11-01 | Disposition: A | Discharge status: Home / Self Care | Payer: Federal, State, Local not specified - PPO | Source: Ambulatory Visit | Attending: Adult Health | Admitting: Adult Health

## 2016-11-01 DIAGNOSIS — Z1231 Encounter for screening mammogram for malignant neoplasm of breast: Secondary | ICD-10-CM

## 2016-12-12 DIAGNOSIS — R197 Diarrhea, unspecified: Secondary | ICD-10-CM | POA: Diagnosis not present

## 2016-12-12 DIAGNOSIS — Z1211 Encounter for screening for malignant neoplasm of colon: Secondary | ICD-10-CM | POA: Diagnosis not present

## 2017-01-01 DIAGNOSIS — Z1211 Encounter for screening for malignant neoplasm of colon: Secondary | ICD-10-CM | POA: Diagnosis not present

## 2017-02-26 DIAGNOSIS — K080 Exfoliation of teeth due to systemic causes: Secondary | ICD-10-CM | POA: Diagnosis not present

## 2017-04-08 ENCOUNTER — Ambulatory Visit (INDEPENDENT_AMBULATORY_CARE_PROVIDER_SITE_OTHER): Payer: Federal, State, Local not specified - PPO | Admitting: Adult Health

## 2017-04-08 ENCOUNTER — Encounter: Payer: Self-pay | Admitting: Adult Health

## 2017-04-08 ENCOUNTER — Other Ambulatory Visit (HOSPITAL_COMMUNITY)
Admission: RE | Admit: 2017-04-08 | Discharge: 2017-04-08 | Disposition: A | Discharge status: Home / Self Care | Payer: Federal, State, Local not specified - PPO | Source: Ambulatory Visit | Attending: Adult Health | Admitting: Adult Health

## 2017-04-08 VITALS — BP 110/62 | HR 78 | Resp 18 | Ht 59.5 in | Wt 87.0 lb

## 2017-04-08 DIAGNOSIS — Z01411 Encounter for gynecological examination (general) (routine) with abnormal findings: Secondary | ICD-10-CM

## 2017-04-08 DIAGNOSIS — F4329 Adjustment disorder with other symptoms: Secondary | ICD-10-CM

## 2017-04-08 DIAGNOSIS — Z01419 Encounter for gynecological examination (general) (routine) without abnormal findings: Secondary | ICD-10-CM | POA: Insufficient documentation

## 2017-04-08 DIAGNOSIS — Z1212 Encounter for screening for malignant neoplasm of rectum: Secondary | ICD-10-CM

## 2017-04-08 DIAGNOSIS — N950 Postmenopausal bleeding: Secondary | ICD-10-CM

## 2017-04-08 DIAGNOSIS — E559 Vitamin D deficiency, unspecified: Secondary | ICD-10-CM

## 2017-04-08 DIAGNOSIS — Z1211 Encounter for screening for malignant neoplasm of colon: Secondary | ICD-10-CM | POA: Diagnosis not present

## 2017-04-08 DIAGNOSIS — K580 Irritable bowel syndrome with diarrhea: Secondary | ICD-10-CM

## 2017-04-08 LAB — HEMOCCULT GUIAC POC 1CARD (OFFICE): Fecal Occult Blood, POC: NEGATIVE

## 2017-04-08 MED ORDER — LORAZEPAM 0.5 MG PO TABS: ORAL_TABLET | ORAL | 0 refills | Status: DC

## 2017-04-08 MED ORDER — DICYCLOMINE HCL 10 MG PO CAPS: 10.0000 mg | ORAL_CAPSULE | Freq: Three times a day (TID) | ORAL | 1 refills | Status: DC

## 2017-04-08 NOTE — Progress Notes (Signed)
Patient ID: Sherri Jackson, female   DOB: [birth year], 51 y.o.   MRN: [id number] History of Present Illness: Sherri Jackson is a 51 year old white female in for well woman gyn exam and pap.She had bleeding in April 2018, and period was over a year before that.Stil has stress.   Current Medications, Allergies, Past Medical History, Past Surgical History, Family History and Social History were reviewed in [employer] record.     Review of Systems:  Patient denies any headaches, hearing loss, fatigue, blurred vision, shortness of breath, chest pain, abdominal pain, problems with  urination, or intercourse. No joint pain or mood swings.Has bloating and diarrhea after eating.Had normal colonoscopy in July 2018.   Physical Exam:BP 110/62   Pulse 78   Resp 18   Ht 4' 11.5" (1.511 m)   Wt 87 lb (39.5 kg)   LMP 11/15/2013 Comment: bleeding 09/2016  SpO2 99%   BMI 17.28 kg/m  General:  Well developed, well nourished, no acute distress Skin:  Warm and dry Neck:  Midline trachea, normal thyroid, good ROM, no lymphadenopathy Lungs; Clear to auscultation bilaterally Breast:  No dominant palpable mass, retraction, or nipple discharge Cardiovascular: Regular rate and rhythm Abdomen:  Soft, non tender, no hepatosplenomegaly Pelvic:  External genitalia is normal in appearance, no lesions.  The vagina is normal in appearance. Urethra has no lesions or masses. The cervix is smooth, pap with HPV performed.  Uterus is felt to be normal size, shape, and contour.  No adnexal masses or tenderness noted.Bladder is non tender, no masses felt. Rectal: Good sphincter tone, no polyps, or hemorrhoids felt.  Hemoccult negative. Extremities/musculoskeletal:  No swelling or varicosities noted, no clubbing or cyanosis Psych:  No mood changes, alert and cooperative,seems happy PHQ 2 score 0.  Impression: 1. Well woman exam with routine gynecological exam   2. PMB (postmenopausal bleeding)   3.  Screening for colorectal cancer   4. Vitamin D deficiency   5. Stress and adjustment reaction   6. Irritable bowel syndrome with diarrhea       Plan: Meds ordered this encounter  Medications  . dicyclomine (BENTYL) 10 MG capsule    Sig: Take 1 capsule (10 mg total) by mouth 3 (three) times daily before meals.    Dispense:  90 capsule    Refill:  1    Order Specific Question:   Supervising Provider    Answer:   Elonda Husky, LUTHER H [[id number]]  . LORazepam (ATIVAN) 0.5 MG tablet    Sig: Take 1 every 12 hours as needed    Dispense:  30 tablet    Refill:  0    Order Specific Question:   Supervising Provider    Answer:   Tania Ade H [[id number]]  Check CBC,CMP,TSH and lipids,Vitamin D and B12 GYN [place] in 1 week to assess PMB Physical in 1 year Pap in 3 if normal

## 2017-04-09 LAB — COMPREHENSIVE METABOLIC PANEL
A/G RATIO: 2.3 — AB (ref 1.2–2.2)
ALBUMIN: 4.8 g/dL (ref 3.5–5.5)
ALT: 15 IU/L (ref 0–32)
AST: 19 IU/L (ref 0–40)
Alkaline Phosphatase: 50 IU/L (ref 39–117)
BUN / CREAT RATIO: 18 (ref 9–23)
BUN: 9 mg/dL (ref 6–24)
Bilirubin Total: 0.2 mg/dL (ref 0.0–1.2)
CALCIUM: 9.9 mg/dL (ref 8.7–10.2)
CO2: 22 mmol/L (ref 20–29)
CREATININE: 0.5 mg/dL — AB (ref 0.57–1.00)
Chloride: 100 mmol/L (ref 96–106)
GFR, EST AFRICAN AMERICAN: 131 mL/min/{1.73_m2} (ref >59)
GFR, EST NON AFRICAN AMERICAN: 113 mL/min/{1.73_m2} (ref >59)
GLOBULIN, TOTAL: 2.1 g/dL (ref 1.5–4.5)
Glucose: 76 mg/dL (ref 65–99)
POTASSIUM: 4.6 mmol/L (ref 3.5–5.2)
SODIUM: 138 mmol/L (ref 134–144)
Total Protein: 6.9 g/dL (ref 6.0–8.5)

## 2017-04-09 LAB — CBC
HEMATOCRIT: 42.5 % (ref 34.0–46.6)
HEMOGLOBIN: 14 g/dL (ref 11.1–15.9)
MCH: 33.6 pg — ABNORMAL HIGH (ref 26.6–33.0)
MCHC: 32.9 g/dL (ref 31.5–35.7)
MCV: 102 fL — ABNORMAL HIGH (ref 79–97)
Platelets: 569 10*3/uL — ABNORMAL HIGH (ref 150–379)
RBC: 4.17 x10E6/uL (ref 3.77–5.28)
RDW: 13.3 % (ref 12.3–15.4)
WBC: 10.8 10*3/uL (ref 3.4–10.8)

## 2017-04-09 LAB — CYTOLOGY - PAP
ADEQUACY: ABSENT
Diagnosis: NEGATIVE
HPV: NOT DETECTED

## 2017-04-09 LAB — VITAMIN B12

## 2017-04-09 LAB — LIPID PANEL
CHOL/HDL RATIO: 2.6 ratio (ref 0.0–4.4)
Cholesterol, Total: 209 mg/dL — ABNORMAL HIGH (ref 100–199)
HDL: 81 mg/dL (ref >39)
LDL CALC: 114 mg/dL — AB (ref 0–99)
Triglycerides: 72 mg/dL (ref 0–149)
VLDL CHOLESTEROL CAL: 14 mg/dL (ref 5–40)

## 2017-04-09 LAB — TSH: TSH: 0.975 u[IU]/mL (ref 0.450–4.500)

## 2017-04-09 LAB — VITAMIN D 25 HYDROXY (VIT D DEFICIENCY, FRACTURES): Vit D, 25-Hydroxy: 87.5 ng/mL (ref 30.0–100.0)

## 2017-04-16 ENCOUNTER — Ambulatory Visit (INDEPENDENT_AMBULATORY_CARE_PROVIDER_SITE_OTHER): Payer: Federal, State, Local not specified - PPO

## 2017-04-16 DIAGNOSIS — N950 Postmenopausal bleeding: Secondary | ICD-10-CM

## 2017-04-16 NOTE — Progress Notes (Signed)
PELVIC US TA/TV: heterogeneous retroverted uterus w/echogenic linear striations and increased vascularity, ? Adenomyosis,poorly defined thickened endometrium,EEC 10.5 mm,normal right ovary, 6.2 x 5.6 x 6.2 cm simple cyst and a cystic tubular structure in the right adnexa,right hydrosalpinx,normal left ovary w/simple exophytic cyst 2.4 x 1.9 x  2.3 cm,no free fluid

## 2017-04-17 ENCOUNTER — Telehealth: Payer: Self-pay | Admitting: Adult Health

## 2017-04-17 DIAGNOSIS — N950 Postmenopausal bleeding: Secondary | ICD-10-CM

## 2017-04-17 NOTE — Telephone Encounter (Signed)
Pt aware endometrium thickened at 10.5 mm on [place] and has right hydrosalpinx with rt adnexal cyst,, will need endometrial biopsy, scheduled with Dr Elonda Husky.

## 2017-04-26 ENCOUNTER — Other Ambulatory Visit: Payer: Self-pay | Admitting: Obstetrics & Gynecology

## 2017-04-26 ENCOUNTER — Ambulatory Visit (INDEPENDENT_AMBULATORY_CARE_PROVIDER_SITE_OTHER): Payer: Federal, State, Local not specified - PPO | Admitting: Obstetrics & Gynecology

## 2017-04-26 ENCOUNTER — Encounter: Payer: Self-pay | Admitting: Obstetrics & Gynecology

## 2017-04-26 VITALS — BP 100/60 | HR 74 | Wt 89.0 lb

## 2017-04-26 DIAGNOSIS — Z3202 Encounter for pregnancy test, result negative: Secondary | ICD-10-CM

## 2017-04-26 DIAGNOSIS — N950 Postmenopausal bleeding: Secondary | ICD-10-CM | POA: Diagnosis not present

## 2017-04-26 DIAGNOSIS — R9389 Abnormal findings on diagnostic imaging of other specified body structures: Secondary | ICD-10-CM | POA: Diagnosis not present

## 2017-04-26 LAB — POCT URINE PREGNANCY: PREG TEST UR: NEGATIVE

## 2017-04-26 NOTE — Addendum Note (Signed)
Addended by: Diona Fanti A on: 04/26/2017 12:29 PM   Modules accepted: Orders

## 2017-04-26 NOTE — Progress Notes (Signed)
Endometrial Biopsy Procedure Note  Pre-operative Diagnosis: Thickened endometrium with postmenopausal bleeding  Post-operative Diagnosis: Same as above plus cervical stenosis status post a laser ablation of the cervix many years ago  Indications: postmenopausal bleeding  Procedure Details   Urine pregnancy test was done.  The risks (including infection, bleeding, pain, and uterine perforation) and benefits of the procedure were explained to the patient and Written informed consent was obtained.  Antibiotic prophylaxis against endocarditis was not indicated.   The patient was placed in the dorsal lithotomy position.  Bimanual exam showed the uterus to be in the neutral position.  A Graves' speculum inserted in the vagina, and the cervix prepped with povidone iodine.  Endocervical curettage with a Kevorkian curette was not performed.  I attempted to pass the endometrial biopsy Pipelle without success I then attempted to use cervical dilators but the patient was unable to tolerate this and they did not pass As a result I placed a paracervical block The paracervical block was placed at approximately 4:30 and 8:30 bilaterally 10 cc of 0.5% Marcaine was given in both sides Aspiration was performed to ensure no intravascular injection The patient was monitored throughout the placement of the block for adverse symptoms and she had none She tolerated it well and I allowed the paracervical block to set up for greater than 10 minutes After the paracervical block was allowed to set up  A sharp tenaculum was applied to the anterior lip of the cervix for stabilization.  A sterile uterine sound was used to sound the uterus to a depth of 6cm.  A Pipelle endometrial aspirator was used to sample the endometrium.  Sample was sent for pathologic examination.  Condition: Stable  Complications: None  Plan:  The patient was advised to call for any fever or for prolonged or severe pain or bleeding. She was  advised to use OTC ibuprofen as needed for mild to moderate pain. She was advised to avoid vaginal intercourse for 48 hours or until the bleeding has completely stopped.  Attending Physician Documentation: I was present for or performed the following: endometrial  Biopsy  I will send the patient a MyChart message with the report from the biopsy

## 2017-07-25 DIAGNOSIS — H66001 Acute suppurative otitis media without spontaneous rupture of ear drum, right ear: Secondary | ICD-10-CM | POA: Diagnosis not present

## 2017-09-02 DIAGNOSIS — K080 Exfoliation of teeth due to systemic causes: Secondary | ICD-10-CM | POA: Diagnosis not present

## 2017-09-30 ENCOUNTER — Other Ambulatory Visit: Payer: Self-pay | Admitting: Adult Health

## 2017-09-30 ENCOUNTER — Other Ambulatory Visit: Payer: Self-pay | Admitting: Obstetrics & Gynecology

## 2017-09-30 DIAGNOSIS — Z1231 Encounter for screening mammogram for malignant neoplasm of breast: Secondary | ICD-10-CM

## 2017-11-08 ENCOUNTER — Ambulatory Visit
Admission: RE | Admit: 2017-11-08 | Discharge: 2017-11-08 | Disposition: A | Discharge status: Home / Self Care | Payer: Federal, State, Local not specified - PPO | Source: Ambulatory Visit | Attending: Adult Health | Admitting: Adult Health

## 2017-11-08 DIAGNOSIS — Z1231 Encounter for screening mammogram for malignant neoplasm of breast: Secondary | ICD-10-CM

## 2018-03-05 DIAGNOSIS — K080 Exfoliation of teeth due to systemic causes: Secondary | ICD-10-CM | POA: Diagnosis not present

## 2018-05-21 ENCOUNTER — Ambulatory Visit (INDEPENDENT_AMBULATORY_CARE_PROVIDER_SITE_OTHER): Payer: Federal, State, Local not specified - PPO | Admitting: Adult Health

## 2018-05-21 ENCOUNTER — Encounter: Payer: Self-pay | Admitting: Adult Health

## 2018-05-21 VITALS — BP 113/74 | HR 66 | Ht 59.0 in | Wt 88.0 lb

## 2018-05-21 DIAGNOSIS — Z1211 Encounter for screening for malignant neoplasm of colon: Secondary | ICD-10-CM | POA: Diagnosis not present

## 2018-05-21 DIAGNOSIS — Z01419 Encounter for gynecological examination (general) (routine) without abnormal findings: Secondary | ICD-10-CM

## 2018-05-21 DIAGNOSIS — Z1212 Encounter for screening for malignant neoplasm of rectum: Secondary | ICD-10-CM | POA: Diagnosis not present

## 2018-05-21 DIAGNOSIS — Z13220 Encounter for screening for lipoid disorders: Secondary | ICD-10-CM | POA: Diagnosis not present

## 2018-05-21 DIAGNOSIS — D229 Melanocytic nevi, unspecified: Secondary | ICD-10-CM | POA: Diagnosis not present

## 2018-05-21 DIAGNOSIS — F4329 Adjustment disorder with other symptoms: Secondary | ICD-10-CM

## 2018-05-21 DIAGNOSIS — X32XXXD Exposure to sunlight, subsequent encounter: Secondary | ICD-10-CM | POA: Diagnosis not present

## 2018-05-21 DIAGNOSIS — L570 Actinic keratosis: Secondary | ICD-10-CM | POA: Diagnosis not present

## 2018-05-21 DIAGNOSIS — D485 Neoplasm of uncertain behavior of skin: Secondary | ICD-10-CM | POA: Diagnosis not present

## 2018-05-21 DIAGNOSIS — Z1321 Encounter for screening for nutritional disorder: Secondary | ICD-10-CM | POA: Diagnosis not present

## 2018-05-21 LAB — HEMOCCULT GUIAC POC 1CARD (OFFICE): Fecal Occult Blood, POC: NEGATIVE

## 2018-05-21 MED ORDER — LORAZEPAM 0.5 MG PO TABS: ORAL_TABLET | ORAL | 0 refills | Status: DC

## 2018-05-21 NOTE — Progress Notes (Addendum)
Patient ID: Sherri Jackson, female   DOB: 1965/09/25, 52 y.o.   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 No PCP.   Current Medications, Allergies, Past Medical History, Past Surgical History, Family History and Social History were reviewed in [facility] record.     Review of Systems: Patient denies any headaches, hearing loss, fatigue, blurred vision, shortness of breath, chest pain, abdominal pain, problems with bowel movements(diarrhea at times), urination, or intercourse. No joint pain or mood swings. She uses bentyl prn, it makes her sleepy at work.    Physical Exam:BP 113/74 (BP Location: Left Arm, Patient Position: Sitting, Cuff Size: Normal)   Pulse 66   Ht 4\' 11"  (1.499 m)   Wt 88 lb (39.9 kg)   LMP 11/15/2013 Comment: bleeding 09/2016  BMI 17.77 kg/m  General:  Well developed, well nourished, no acute distress Skin:  Warm and dry Neck:  Midline trachea, normal thyroid, good ROM, no lymphadenopathy Lungs; Clear to auscultation bilaterally Breast:  No dominant palpable mass, retraction, or nipple discharge Cardiovascular: Regular rate and rhythm Abdomen:  Soft, non tender, no hepatosplenomegaly Pelvic:  External genitalia is normal in appearance, no lesions.  The vagina is normal in appearance. Urethra has no lesions or masses. The cervix is smooth.  Uterus is felt to be normal size, shape, and contour.  No adnexal masses or tenderness noted.Bladder is non tender, no masses felt. Rectal: Good sphincter tone, no polyps, + hemorrhoids felt.  Hemoccult negative. Extremities/musculoskeletal:  No swelling or varicosities noted, no clubbing or cyanosis Psych:  No mood changes, alert and cooperative,seems  happy PHQ 2 score 0. Examination chaperoned by Estill Bamberg Rash LPN. She requests labs to include B12, and she wants refill on ativan.   Impression: 1. Encounter for well woman exam with routine gynecological exam   2. Screening for colorectal cancer   3. Encounter for vitamin deficiency screening   4. Screening cholesterol level   5. Stress and adjustment reaction       Plan: Physical in 1 year Pap in 2021 Check CBC,CMP,TSH and lipids,vitamin D and B12 Mammogram yearly Colonoscopy per GI Meds ordered this encounter  Medications  . LORazepam (ATIVAN) 0.5 MG tablet    Sig: Take 1 every 12 hours as needed    Dispense:  30 tablet    Refill:  0    Order Specific Question:   Supervising Provider    Answer:   Tania Ade H [[id number]]

## 2018-05-22 LAB — COMPREHENSIVE METABOLIC PANEL
A/G RATIO: 2.5 — AB (ref 1.2–2.2)
ALBUMIN: 5 g/dL (ref 3.5–5.5)
ALT: 15 IU/L (ref 0–32)
AST: 18 IU/L (ref 0–40)
Alkaline Phosphatase: 60 IU/L (ref 39–117)
BILIRUBIN TOTAL: 0.3 mg/dL (ref 0.0–1.2)
BUN / CREAT RATIO: 16 (ref 9–23)
BUN: 8 mg/dL (ref 6–24)
CHLORIDE: 101 mmol/L (ref 96–106)
CO2: 23 mmol/L (ref 20–29)
Calcium: 10.3 mg/dL — ABNORMAL HIGH (ref 8.7–10.2)
Creatinine, Ser: 0.5 mg/dL — ABNORMAL LOW (ref 0.57–1.00)
GFR calc Af Amer: 130 mL/min/{1.73_m2} (ref >59)
GFR, EST NON AFRICAN AMERICAN: 112 mL/min/{1.73_m2} (ref >59)
GLOBULIN, TOTAL: 2 g/dL (ref 1.5–4.5)
Glucose: 86 mg/dL (ref 65–99)
POTASSIUM: 4.8 mmol/L (ref 3.5–5.2)
SODIUM: 140 mmol/L (ref 134–144)
Total Protein: 7 g/dL (ref 6.0–8.5)

## 2018-05-22 LAB — CBC
HEMATOCRIT: 40.7 % (ref 34.0–46.6)
Hemoglobin: 14.1 g/dL (ref 11.1–15.9)
MCH: 34.6 pg — AB (ref 26.6–33.0)
MCHC: 34.6 g/dL (ref 31.5–35.7)
MCV: 100 fL — AB (ref 79–97)
PLATELETS: 571 10*3/uL — AB (ref 150–450)
RBC: 4.08 x10E6/uL (ref 3.77–5.28)
RDW: 11.6 % — ABNORMAL LOW (ref 12.3–15.4)
WBC: 10.7 10*3/uL (ref 3.4–10.8)

## 2018-05-22 LAB — LIPID PANEL
CHOLESTEROL TOTAL: 220 mg/dL — AB (ref 100–199)
Chol/HDL Ratio: 2.7 ratio (ref 0.0–4.4)
HDL: 81 mg/dL (ref >39)
LDL Calculated: 124 mg/dL — ABNORMAL HIGH (ref 0–99)
Triglycerides: 76 mg/dL (ref 0–149)
VLDL Cholesterol Cal: 15 mg/dL (ref 5–40)

## 2018-05-22 LAB — VITAMIN B12: VITAMIN B 12: 1635 pg/mL — AB (ref 232–1245)

## 2018-05-22 LAB — TSH: TSH: 0.781 u[IU]/mL (ref 0.450–4.500)

## 2018-05-22 LAB — VITAMIN D 25 HYDROXY (VIT D DEFICIENCY, FRACTURES): VIT D 25 HYDROXY: 94.2 ng/mL (ref 30.0–100.0)

## 2018-05-23 ENCOUNTER — Other Ambulatory Visit: Payer: Self-pay | Admitting: Adult Health

## 2018-05-23 DIAGNOSIS — R718 Other abnormality of red blood cells: Secondary | ICD-10-CM

## 2018-05-23 NOTE — Progress Notes (Signed)
Refer to hematology 

## 2018-05-28 ENCOUNTER — Inpatient Hospital Stay (HOSPITAL_COMMUNITY): Payer: Federal, State, Local not specified - PPO

## 2018-05-28 ENCOUNTER — Inpatient Hospital Stay (HOSPITAL_COMMUNITY): Payer: Federal, State, Local not specified - PPO | Attending: Internal Medicine | Admitting: Internal Medicine

## 2018-05-28 VITALS — BP 132/69 | HR 78 | Temp 97.7°F | Resp 16 | Wt 88.1 lb

## 2018-05-28 DIAGNOSIS — R5383 Other fatigue: Secondary | ICD-10-CM

## 2018-05-28 DIAGNOSIS — D7589 Other specified diseases of blood and blood-forming organs: Secondary | ICD-10-CM

## 2018-05-28 DIAGNOSIS — D473 Essential (hemorrhagic) thrombocythemia: Secondary | ICD-10-CM | POA: Insufficient documentation

## 2018-05-28 DIAGNOSIS — Z79899 Other long term (current) drug therapy: Secondary | ICD-10-CM | POA: Diagnosis not present

## 2018-05-28 DIAGNOSIS — Z791 Long term (current) use of non-steroidal anti-inflammatories (NSAID): Secondary | ICD-10-CM | POA: Diagnosis not present

## 2018-05-28 DIAGNOSIS — Z7901 Long term (current) use of anticoagulants: Secondary | ICD-10-CM

## 2018-05-28 DIAGNOSIS — Z8249 Family history of ischemic heart disease and other diseases of the circulatory system: Secondary | ICD-10-CM | POA: Diagnosis not present

## 2018-05-28 DIAGNOSIS — F17210 Nicotine dependence, cigarettes, uncomplicated: Secondary | ICD-10-CM | POA: Diagnosis not present

## 2018-05-28 DIAGNOSIS — D75839 Thrombocytosis, unspecified: Secondary | ICD-10-CM

## 2018-05-28 LAB — COMPREHENSIVE METABOLIC PANEL
ALBUMIN: 4.4 g/dL (ref 3.5–5.0)
ALK PHOS: 51 U/L (ref 38–126)
ALT: 17 U/L (ref 0–44)
AST: 19 U/L (ref 15–41)
Anion gap: 7 (ref 5–15)
BUN: 9 mg/dL (ref 6–20)
CALCIUM: 9.4 mg/dL (ref 8.9–10.3)
CO2: 25 mmol/L (ref 22–32)
CREATININE: 0.5 mg/dL (ref 0.44–1.00)
Chloride: 105 mmol/L (ref 98–111)
GFR calc Af Amer: >60 mL/min (ref >60)
GFR calc non Af Amer: >60 mL/min (ref >60)
GLUCOSE: 81 mg/dL (ref 70–99)
POTASSIUM: 4.2 mmol/L (ref 3.5–5.1)
Sodium: 137 mmol/L (ref 135–145)
TOTAL PROTEIN: 7.4 g/dL (ref 6.5–8.1)
Total Bilirubin: 0.5 mg/dL (ref 0.3–1.2)

## 2018-05-28 LAB — CBC WITH DIFFERENTIAL/PLATELET
ABS IMMATURE GRANULOCYTES: 0.01 10*3/uL (ref 0.00–0.07)
BASOS ABS: 0.1 10*3/uL (ref 0.0–0.1)
Basophils Relative: 1 %
Eosinophils Absolute: 1.6 10*3/uL — ABNORMAL HIGH (ref 0.0–0.5)
Eosinophils Relative: 18 %
HCT: 39.9 % (ref 36.0–46.0)
HEMOGLOBIN: 13.4 g/dL (ref 12.0–15.0)
IMMATURE GRANULOCYTES: 0 %
LYMPHS ABS: 2.2 10*3/uL (ref 0.7–4.0)
LYMPHS PCT: 26 %
MCH: 34.8 pg — ABNORMAL HIGH (ref 26.0–34.0)
MCHC: 33.6 g/dL (ref 30.0–36.0)
MCV: 103.6 fL — ABNORMAL HIGH (ref 80.0–100.0)
Monocytes Absolute: 0.6 10*3/uL (ref 0.1–1.0)
Monocytes Relative: 7 %
NEUTROS PCT: 48 %
NRBC: 0 % (ref 0.0–0.2)
Neutro Abs: 4.2 10*3/uL (ref 1.7–7.7)
Platelets: 491 10*3/uL — ABNORMAL HIGH (ref 150–400)
RBC: 3.85 MIL/uL — ABNORMAL LOW (ref 3.87–5.11)
RDW: 11.7 % (ref 11.5–15.5)
WBC: 8.7 10*3/uL (ref 4.0–10.5)

## 2018-05-28 LAB — FOLATE: FOLATE: 10 ng/mL (ref >5.9)

## 2018-05-28 LAB — FERRITIN: Ferritin: 45 ng/mL (ref 11–307)

## 2018-05-28 LAB — C-REACTIVE PROTEIN: CRP: <0.8 mg/dL (ref <1.0)

## 2018-05-28 LAB — LACTATE DEHYDROGENASE: LDH: 128 U/L (ref 98–192)

## 2018-05-28 LAB — SEDIMENTATION RATE: SED RATE: 8 mm/h (ref 0–22)

## 2018-05-28 NOTE — Progress Notes (Signed)
Referring Physician:  [facility] NP Derrek Monaco  Diagnosis Macrocytosis without anemia - Plan: CBC with Differential/Platelet, Comprehensive metabolic panel, Lactate dehydrogenase, Ferritin, Protein electrophoresis, serum, Folate, Methylmalonic acid, serum, Sedimentation rate, C-reactive protein, BCR-ABL1, CML/ALL, PCR, QUANT, JAK2 genotypr, Rheumatoid factor  Thrombocytosis (HCC) - Plan: CBC with Differential/Platelet, Comprehensive metabolic panel, Lactate dehydrogenase, Ferritin, Protein electrophoresis, serum, Folate, Methylmalonic acid, serum, Sedimentation rate, C-reactive protein, BCR-ABL1, CML/ALL, PCR, QUANT, JAK2 genotypr, Rheumatoid factor  Staging Cancer Staging No matching staging information was found for the patient.  Assessment and Plan:  1.  Macrocytosis.  52 year old female referred for evaluation due to elevated MCV.  Pt reports hair is thinning.  She denies any blood in stool or urine.  Pt had labs done 05/21/2018 that showe WBC 10.7 HB 14 plts 571,000.  Chemistries WNL with K+ 4.8 Cr 0.5 and normal LFTs.  B12 level was 1635.  TSH was 0.781.  Pt is on oral B12.  She has undergone colonoscopy in the past.  Mammogram was 11/08/2017 and was negative.  Pt is seen today for consultation due to macrocytosis.   Labs done today 05/28/2018 reviewed and showed WBC 8.7 HB 13.4 plts 491,000.  MCV 103.  Chemistries WNL with K+ 4.2, Cr 0.5 and normal LFTs.  Awaiting results of MMA.  Pt will RTC in 2 weeks to go over results.    2  Thrombocytosis.  Plt count was 571,000.  Labs repeated today show plt count improved at 491,000.  Awaiting Sed rate, CRP, Jak 2, RF, BCR/ABL, ferritin.  Pt will follow-up to go over results.    3.  Fatigue and hair thinning.  Pt had recent TSH was WNL at 0.781.  Awaiting Ferritin, MMA, Folate and SPEP results.  PT advised to add MVI to determine if symptoms improve.   4  Health maintenance.  Pt has undergone colonoscopy in the past.  She had mammogram in  10/2017 that was WNL.  GI and mammogram follow-up as recommended.  Follow-up with PCP as directed.    40 minutes spent with more than 50% spent in review of records, counseling and coordination of care.    HPI:  52 year old female referred for evaluation due to elevated MCV.  Pt reports hair is thinning.  She denies any blood in stool or urine.  Pt had labs done 05/21/2018 that showe WBC 10.7 HB 14 plts 571,000.  Chemistries WNL with K+ 4.8 Cr 0.5 and normal LFTs.  B12 level was 1635.  TSH was 0.781.  Pt is on oral B12.  She has undergone colonoscopy in the past.  Mammogram was 11/08/2017 and was negative.  Pt is seen today for consultation due to macrocytosis.   Problem List Patient Active Problem List   Diagnosis Date Noted  . Encounter for well woman exam with routine gynecological exam [Z30.076] 05/21/2018  . Screening for colorectal cancer [Z12.11, Z12.12] 05/21/2018  . Encounter for vitamin deficiency screening [Z13.21] 05/21/2018  . Screening cholesterol level [Z13.220] 05/21/2018  . PMB (postmenopausal bleeding) [N95.0] 04/08/2017  . Well woman exam with routine gynecological exam [A26.333] 04/08/2017  . Irritable bowel syndrome with diarrhea [K58.0] 04/08/2017  . Vitamin D deficiency [E55.9] 03/20/2016  . Elevated MCV [R71.8] 03/20/2016  . Postmenopausal [Z78.0] 03/19/2016  . Hot flashes [R23.2] 03/19/2016  . Hemorrhoid [K64.9] 03/19/2016  . Stress and adjustment reaction [F43.29] 03/19/2016  . Peri-menopausal [N95.1] 12/01/2013  . Enlarged thyroid [E04.9] 12/01/2013    Past Medical History Past Medical History:  Diagnosis  Date  . Elevated MCV 03/20/2016   Will check B12 and folate  . Enlarged thyroid 12/01/2013  . Peri-menopausal 12/01/2013  . Vaginal Pap smear, abnormal   . Vitamin D deficiency 03/20/2016    Past Surgical History Past Surgical History:  Procedure Laterality Date  . ANKLE SURGERY Left   . APPENDECTOMY    . HYSTEROSCOPY W/ ENDOMETRIAL ABLATION  2004  .  laser conization of cervix  2004    Family History Family History  Problem Relation Age of Onset  . Cancer Father        lung  . COPD Father   . Emphysema Father   . Hyperlipidemia Father   . Hypertension Father   . Thyroid disease Father   . Heart disease Paternal Grandmother   . Heart attack Paternal Grandfather   . Diabetes Paternal Uncle   . Breast cancer Cousin   . Breast cancer Cousin      Social History  reports that she has been smoking cigarettes. She has a 16.50 pack-year smoking history. She has never used smokeless tobacco. She reports that she drinks alcohol. She reports that she does not use drugs.  Medications  Current Outpatient Medications:  .  Biotin 5000 MCG CAPS, Take by mouth daily. , Disp: , Rfl:  .  cetirizine (ZYRTEC) 10 MG tablet, Take 10 mg by mouth daily., Disp: , Rfl:  .  Cholecalciferol 5000 units capsule, Take 1 capsule (5,000 Units total) by mouth daily., Disp: , Rfl:  .  dicyclomine (BENTYL) 10 MG capsule, Take 1 capsule (10 mg total) by mouth 3 (three) times daily before meals. (Patient not taking: Reported on 04/26/2017), Disp: 90 capsule, Rfl: 1 .  ibuprofen (ADVIL,MOTRIN) 200 MG tablet, Take 400 mg by mouth as needed., Disp: , Rfl:  .  LORazepam (ATIVAN) 0.5 MG tablet, Take 1 every 12 hours as needed, Disp: 30 tablet, Rfl: 0 .  Multiple Vitamin (MULTIVITAMIN) tablet, Take 1 tablet by mouth daily., Disp: , Rfl:  .  vitamin B-12 (CYANOCOBALAMIN) 100 MCG tablet, Take 100 mcg by mouth daily., Disp: , Rfl:   Allergies Patient has no known allergies.  Review of Systems Review of Systems - Oncology ROS negative other than hair thinning and fatigue   Physical Exam  Vitals Wt Readings from Last 3 Encounters:  05/28/18 88 lb 1.6 oz (40 kg)  05/21/18 88 lb (39.9 kg)  04/26/17 89 lb (40.4 kg)   Temp Readings from Last 3 Encounters:  05/28/18 97.7 F (36.5 C) (Oral)  07/06/15 98.3 F (36.8 C) (Oral)   BP Readings from Last 3 Encounters:   05/28/18 132/69  05/21/18 113/74  04/26/17 100/60   Pulse Readings from Last 3 Encounters:  05/28/18 78  05/21/18 66  04/26/17 74   Constitutional: Well-developed, well-nourished, and in no distress.   HENT: Head: Normocephalic and atraumatic.  Mouth/Throat: No oropharyngeal exudate. Mucosa moist. Eyes: Pupils are equal, round, and reactive to light. Conjunctivae are normal. No scleral icterus.  Neck: Normal range of motion. Neck supple. No JVD present.  Cardiovascular: Normal rate, regular rhythm and normal heart sounds.  Exam reveals no gallop and no friction rub.   No murmur heard. Pulmonary/Chest: Effort normal and breath sounds normal. No respiratory distress. No wheezes.No rales.  Abdominal: Soft. Bowel sounds are normal. No distension. There is no tenderness. There is no guarding.  Musculoskeletal: No edema or tenderness.  Lymphadenopathy: No cervical,axillary or supraclavicular adenopathy.  Neurological: Alert and oriented to person, place, and time.  No cranial nerve deficit.  Skin: Skin is warm and dry. No rash noted. No erythema. No pallor.  Psychiatric: Affect and judgment normal.   Labs Appointment on 05/28/2018  Component Date Value Ref Range Status  . WBC 05/28/2018 8.7  4.0 - 10.5 K/uL Final  . RBC 05/28/2018 3.85* 3.87 - 5.11 MIL/uL Final  . Hemoglobin 05/28/2018 13.4  12.0 - 15.0 g/dL Final  . HCT 05/28/2018 39.9  36.0 - 46.0 % Final  . MCV 05/28/2018 103.6* 80.0 - 100.0 fL Final  . MCH 05/28/2018 34.8* 26.0 - 34.0 pg Final  . MCHC 05/28/2018 33.6  30.0 - 36.0 g/dL Final  . RDW 05/28/2018 11.7  11.5 - 15.5 % Final  . Platelets 05/28/2018 491* 150 - 400 K/uL Final  . nRBC 05/28/2018 0.0  0.0 - 0.2 % Final  . Neutrophils Relative % 05/28/2018 48  % Final  . Neutro Abs 05/28/2018 4.2  1.7 - 7.7 K/uL Final  . Lymphocytes Relative 05/28/2018 26  % Final  . Lymphs Abs 05/28/2018 2.2  0.7 - 4.0 K/uL Final  . Monocytes Relative 05/28/2018 7  % Final  . Monocytes  Absolute 05/28/2018 0.6  0.1 - 1.0 K/uL Final  . Eosinophils Relative 05/28/2018 18  % Final  . Eosinophils Absolute 05/28/2018 1.6* 0.0 - 0.5 K/uL Final  . Basophils Relative 05/28/2018 1  % Final  . Basophils Absolute 05/28/2018 0.1  0.0 - 0.1 K/uL Final  . Immature Granulocytes 05/28/2018 0  % Final  . Abs Immature Granulocytes 05/28/2018 0.01  0.00 - 0.07 K/uL Final   Performed at [facility], [address], [place], [place] [address]  . Sodium 05/28/2018 137  135 - 145 mmol/L Final  . Potassium 05/28/2018 4.2  3.5 - 5.1 mmol/L Final  . Chloride 05/28/2018 105  98 - 111 mmol/L Final  . CO2 05/28/2018 25  22 - 32 mmol/L Final  . Glucose, Bld 05/28/2018 81  70 - 99 mg/dL Final  . BUN 05/28/2018 9  6 - 20 mg/dL Final  . Creatinine, Ser 05/28/2018 0.50  0.44 - 1.00 mg/dL Final  . Calcium 05/28/2018 9.4  8.9 - 10.3 mg/dL Final  . Total Protein 05/28/2018 7.4  6.5 - 8.1 g/dL Final  . Albumin 05/28/2018 4.4  3.5 - 5.0 g/dL Final  . AST 05/28/2018 19  15 - 41 U/L Final  . ALT 05/28/2018 17  0 - 44 U/L Final  . Alkaline Phosphatase 05/28/2018 51  38 - 126 U/L Final  . Total Bilirubin 05/28/2018 0.5  0.3 - 1.2 mg/dL Final  . GFR calc non Af Amer 05/28/2018 >60  >60 mL/min Final  . GFR calc Af Amer 05/28/2018 >60  >60 mL/min Final  . Anion gap 05/28/2018 7  5 - 15 Final   Performed at [facility], [address], [place], [place] [address]  . LDH 05/28/2018 128  98 - 192 U/L Final   Performed at [facility], [address] [place], [place] [address]     Pathology Orders Placed This Encounter  Procedures  . CBC with Differential/Platelet    Standing Status:   Future    Number of Occurrences:   1    Standing Expiration Date:   05/29/2019  . Comprehensive metabolic panel    Standing Status:   Future    Number of Occurrences:   1    Standing Expiration Date:   05/29/2019  . Lactate dehydrogenase    Standing Status:  Future    Number of Occurrences:   1    Standing Expiration Date:    05/29/2019  . Ferritin    Standing Status:   Future    Number of Occurrences:   1    Standing Expiration Date:   05/29/2019  . Protein electrophoresis, serum    Standing Status:   Future    Number of Occurrences:   1    Standing Expiration Date:   05/29/2019  . Folate    Standing Status:   Future    Number of Occurrences:   1    Standing Expiration Date:   05/29/2019  . Methylmalonic acid, serum    Standing Status:   Future    Number of Occurrences:   1    Standing Expiration Date:   05/29/2019  . Sedimentation rate    Standing Status:   Future    Number of Occurrences:   1    Standing Expiration Date:   05/29/2019  . C-reactive protein    Standing Status:   Future    Number of Occurrences:   1    Standing Expiration Date:   05/29/2019  . BCR-ABL1, CML/ALL, PCR, QUANT    Standing Status:   Future    Number of Occurrences:   1    Standing Expiration Date:   05/29/2019  . JAK2 genotypr    Standing Status:   Future    Number of Occurrences:   1    Standing Expiration Date:   05/29/2019  . Rheumatoid factor    Standing Status:   Future    Number of Occurrences:   1    Standing Expiration Date:   05/29/2019       Zoila Shutter MD

## 2018-05-29 LAB — RHEUMATOID FACTOR

## 2018-06-02 LAB — BCR-ABL1, CML/ALL, PCR, QUANT

## 2018-06-02 LAB — JAK2 GENOTYPR

## 2018-06-02 LAB — PROTEIN ELECTROPHORESIS, SERUM
A/G RATIO SPE: 1.7 (ref 0.7–1.7)
ALPHA-2-GLOBULIN: 0.7 g/dL (ref 0.4–1.0)
Albumin ELP: 4.3 g/dL (ref 2.9–4.4)
Alpha-1-Globulin: 0.3 g/dL (ref 0.0–0.4)
Beta Globulin: 1 g/dL (ref 0.7–1.3)
Gamma Globulin: 0.6 g/dL (ref 0.4–1.8)
Globulin, Total: 2.5 g/dL (ref 2.2–3.9)
Total Protein ELP: 6.8 g/dL (ref 6.0–8.5)

## 2018-06-02 LAB — METHYLMALONIC ACID, SERUM: Methylmalonic Acid, Quantitative: 202 nmol/L (ref 0–378)

## 2018-06-09 DIAGNOSIS — D485 Neoplasm of uncertain behavior of skin: Secondary | ICD-10-CM | POA: Diagnosis not present

## 2018-06-09 DIAGNOSIS — X32XXXD Exposure to sunlight, subsequent encounter: Secondary | ICD-10-CM | POA: Diagnosis not present

## 2018-06-09 DIAGNOSIS — L988 Other specified disorders of the skin and subcutaneous tissue: Secondary | ICD-10-CM | POA: Diagnosis not present

## 2018-06-09 DIAGNOSIS — L570 Actinic keratosis: Secondary | ICD-10-CM | POA: Diagnosis not present

## 2018-06-16 ENCOUNTER — Inpatient Hospital Stay (HOSPITAL_COMMUNITY): Payer: Federal, State, Local not specified - PPO | Attending: Internal Medicine | Admitting: Internal Medicine

## 2018-06-16 ENCOUNTER — Other Ambulatory Visit: Payer: Self-pay

## 2018-06-16 ENCOUNTER — Encounter (HOSPITAL_COMMUNITY): Payer: Self-pay | Admitting: Internal Medicine

## 2018-06-16 VITALS — BP 125/72 | HR 59 | Temp 98.1°F | Resp 14 | Wt 87.9 lb

## 2018-06-16 DIAGNOSIS — F17210 Nicotine dependence, cigarettes, uncomplicated: Secondary | ICD-10-CM | POA: Diagnosis not present

## 2018-06-16 DIAGNOSIS — D7589 Other specified diseases of blood and blood-forming organs: Secondary | ICD-10-CM | POA: Diagnosis not present

## 2018-06-16 DIAGNOSIS — Z79899 Other long term (current) drug therapy: Secondary | ICD-10-CM

## 2018-06-16 DIAGNOSIS — R5383 Other fatigue: Secondary | ICD-10-CM

## 2018-06-16 DIAGNOSIS — R7989 Other specified abnormal findings of blood chemistry: Secondary | ICD-10-CM | POA: Diagnosis not present

## 2018-06-16 NOTE — Progress Notes (Signed)
Diagnosis Macrocytosis without anemia - Plan: CBC with Differential/Platelet, Comprehensive metabolic panel, Lactate dehydrogenase  Staging Cancer Staging No matching staging information was found for the patient.  Assessment and Plan:  1.  Macrocytosis.  52 year old female referred for evaluation due to elevated MCV.  Pt reports hair is thinning.  She denies any blood in stool or urine.  Pt had labs done 05/21/2018 that showe WBC 10.7 HB 14 plts 571,000.  Chemistries WNL with K+ 4.8 Cr 0.5 and normal LFTs.  B12 level was 1635.  TSH was 0.781.  Pt is on oral B12.  She has undergone colonoscopy in the past.  Mammogram was 11/08/2017 and was negative.  Pt initially seen for evaluation due to macrocytosis.   Labs done 05/28/2018 reviewed and showed WBC 8.7 HB 13.4 plts 491,000.  MCV 103.  Chemistries WNL with K+ 4.2, Cr 0.5 and normal LFTs.  B12, folate, MMA WNL.  Jak 2 and BCR/ABL WNL.  I have discussed with pt is any worsening of counts will recommend bone marrow biopsy for definitive diagnosis  Pt also advised to have nutrition assessment.  She will be seen for follow-up in 12/2018 with repeat labs.    2  Thrombocytosis.  Plt count was 571,000.  Labs repeated 05/28/2018 showed plt count improved at 491,000.  She has normal Sed rate, CRP, Jak 2, RF, BCR/ABL, ferritin.  Will repeat labs in 12/2018.   3.  Fatigue and hair thinning.  Pt had recent TSH was WNL at 0.781.  She has normal Ferritin, MMA, Folate and SPEP.  PT advised to add MVI to determine if symptoms improve.   4  Health maintenance.  Pt has undergone colonoscopy in the past.  She had mammogram in 10/2017 that was WNL.  GI and mammogram follow-up as recommended.  Follow-up with PCP as directed.     Interval history:  Historical data obtained from note dated 05/28/2018.  52 year old female referred for evaluation due to elevated MCV.  Pt reports hair is thinning.  She denies any blood in stool or urine.  Pt had labs done 05/21/2018 that showe  WBC 10.7 HB 14 plts 571,000.  Chemistries WNL with K+ 4.8 Cr 0.5 and normal LFTs.  B12 level was 1635.  TSH was 0.781.  Pt is on oral B12.  She has undergone colonoscopy in the past.  Mammogram was 11/08/2017 and was negative.  Pt seen initially due to macrocytosis.   Current Status:  Pt is seen today for follow-up.  She is here to go over labs.    Problem List Patient Active Problem List   Diagnosis Date Noted  . Encounter for well woman exam with routine gynecological exam [Z99.357] 05/21/2018  . Screening for colorectal cancer [Z12.11, Z12.12] 05/21/2018  . Encounter for vitamin deficiency screening [Z13.21] 05/21/2018  . Screening cholesterol level [Z13.220] 05/21/2018  . PMB (postmenopausal bleeding) [N95.0] 04/08/2017  . Well woman exam with routine gynecological exam [S17.793] 04/08/2017  . Irritable bowel syndrome with diarrhea [K58.0] 04/08/2017  . Vitamin D deficiency [E55.9] 03/20/2016  . Elevated MCV [R71.8] 03/20/2016  . Postmenopausal [Z78.0] 03/19/2016  . Hot flashes [R23.2] 03/19/2016  . Hemorrhoid [K64.9] 03/19/2016  . Stress and adjustment reaction [F43.29] 03/19/2016  . Peri-menopausal [N95.1] 12/01/2013  . Enlarged thyroid [E04.9] 12/01/2013    Past Medical History Past Medical History:  Diagnosis Date  . Elevated MCV 03/20/2016   Will check B12 and folate  . Enlarged thyroid 12/01/2013  . Peri-menopausal 12/01/2013  .  Vaginal Pap smear, abnormal   . Vitamin D deficiency 03/20/2016    Past Surgical History Past Surgical History:  Procedure Laterality Date  . ANKLE SURGERY Left   . APPENDECTOMY    . HYSTEROSCOPY W/ ENDOMETRIAL ABLATION  2004  . laser conization of cervix  2004    Family History Family History  Problem Relation Age of Onset  . Cancer Father        lung  . COPD Father   . Emphysema Father   . Hyperlipidemia Father   . Hypertension Father   . Thyroid disease Father   . Heart disease Paternal Grandmother   . Heart attack Paternal  Grandfather   . Diabetes Paternal Uncle   . Breast cancer Cousin   . Breast cancer Cousin      Social History  reports that she has been smoking cigarettes. She has a 16.50 pack-year smoking history. She has never used smokeless tobacco. She reports current alcohol use. She reports that she does not use drugs.  Medications  Current Outpatient Medications:  .  cetirizine (ZYRTEC) 10 MG tablet, Take 10 mg by mouth daily., Disp: , Rfl:  .  Cholecalciferol 5000 units capsule, Take 1 capsule (5,000 Units total) by mouth daily. (Patient taking differently: Take 5,000 Units by mouth every other day. ), Disp: , Rfl:  .  ibuprofen (ADVIL,MOTRIN) 200 MG tablet, Take 400 mg by mouth as needed., Disp: , Rfl:  .  LORazepam (ATIVAN) 0.5 MG tablet, Take 1 every 12 hours as needed, Disp: 30 tablet, Rfl: 0 .  Multiple Vitamin (MULTIVITAMIN) tablet, Take 1 tablet by mouth daily., Disp: , Rfl:   Allergies Patient has no known allergies.  Review of Systems Review of Systems - Oncology ROS negative   Physical Exam  Vitals Wt Readings from Last 3 Encounters:  06/16/18 87 lb 14.4 oz (39.9 kg)  05/28/18 88 lb 1.6 oz (40 kg)  05/21/18 88 lb (39.9 kg)   Temp Readings from Last 3 Encounters:  06/16/18 98.1 F (36.7 C) (Oral)  05/28/18 97.7 F (36.5 C) (Oral)  07/06/15 98.3 F (36.8 C) (Oral)   BP Readings from Last 3 Encounters:  06/16/18 125/72  05/28/18 132/69  05/21/18 113/74   Pulse Readings from Last 3 Encounters:  06/16/18 (!) 59  05/28/18 78  05/21/18 66   Constitutional: Well-developed, well-nourished, and in no distress.   HENT: Head: Normocephalic and atraumatic.  Mouth/Throat: No oropharyngeal exudate. Mucosa moist. Eyes: Pupils are equal, round, and reactive to light. Conjunctivae are normal. No scleral icterus.  Neck: Normal range of motion. Neck supple. No JVD present.  Cardiovascular: Normal rate, regular rhythm and normal heart sounds.  Exam reveals no gallop and no  friction rub.   No murmur heard. Pulmonary/Chest: Effort normal and breath sounds normal. No respiratory distress. No wheezes.No rales.  Abdominal: Soft. Bowel sounds are normal. No distension. There is no tenderness. There is no guarding.  Musculoskeletal: No edema or tenderness.  Lymphadenopathy: No cervical, axillary or supraclavicular adenopathy.  Neurological: Alert and oriented to person, place, and time. No cranial nerve deficit.  Skin: Skin is warm and dry. No rash noted. No erythema. No pallor.  Psychiatric: Affect and judgment normal.   Labs No visits with results within 3 Day(s) from this visit.  Latest known visit with results is:  Appointment on 05/28/2018  Component Date Value Ref Range Status  . WBC 05/28/2018 8.7  4.0 - 10.5 K/uL Final  . RBC 05/28/2018 3.85* 3.87 -  5.11 MIL/uL Final  . Hemoglobin 05/28/2018 13.4  12.0 - 15.0 g/dL Final  . HCT 05/28/2018 39.9  36.0 - 46.0 % Final  . MCV 05/28/2018 103.6* 80.0 - 100.0 fL Final  . MCH 05/28/2018 34.8* 26.0 - 34.0 pg Final  . MCHC 05/28/2018 33.6  30.0 - 36.0 g/dL Final  . RDW 05/28/2018 11.7  11.5 - 15.5 % Final  . Platelets 05/28/2018 491* 150 - 400 K/uL Final  . nRBC 05/28/2018 0.0  0.0 - 0.2 % Final  . Neutrophils Relative % 05/28/2018 48  % Final  . Neutro Abs 05/28/2018 4.2  1.7 - 7.7 K/uL Final  . Lymphocytes Relative 05/28/2018 26  % Final  . Lymphs Abs 05/28/2018 2.2  0.7 - 4.0 K/uL Final  . Monocytes Relative 05/28/2018 7  % Final  . Monocytes Absolute 05/28/2018 0.6  0.1 - 1.0 K/uL Final  . Eosinophils Relative 05/28/2018 18  % Final  . Eosinophils Absolute 05/28/2018 1.6* 0.0 - 0.5 K/uL Final  . Basophils Relative 05/28/2018 1  % Final  . Basophils Absolute 05/28/2018 0.1  0.0 - 0.1 K/uL Final  . Immature Granulocytes 05/28/2018 0  % Final  . Abs Immature Granulocytes 05/28/2018 0.01  0.00 - 0.07 K/uL Final   Performed at [facility], [address], [place], [place] [address]  . Sodium 05/28/2018 137   135 - 145 mmol/L Final  . Potassium 05/28/2018 4.2  3.5 - 5.1 mmol/L Final  . Chloride 05/28/2018 105  98 - 111 mmol/L Final  . CO2 05/28/2018 25  22 - 32 mmol/L Final  . Glucose, Bld 05/28/2018 81  70 - 99 mg/dL Final  . BUN 05/28/2018 9  6 - 20 mg/dL Final  . Creatinine, Ser 05/28/2018 0.50  0.44 - 1.00 mg/dL Final  . Calcium 05/28/2018 9.4  8.9 - 10.3 mg/dL Final  . Total Protein 05/28/2018 7.4  6.5 - 8.1 g/dL Final  . Albumin 05/28/2018 4.4  3.5 - 5.0 g/dL Final  . AST 05/28/2018 19  15 - 41 U/L Final  . ALT 05/28/2018 17  0 - 44 U/L Final  . Alkaline Phosphatase 05/28/2018 51  38 - 126 U/L Final  . Total Bilirubin 05/28/2018 0.5  0.3 - 1.2 mg/dL Final  . GFR calc non Af Amer 05/28/2018 >60  >60 mL/min Final  . GFR calc Af Amer 05/28/2018 >60  >60 mL/min Final  . Anion gap 05/28/2018 7  5 - 15 Final   Performed at [facility], [address] [place], [place] [address]  . LDH 05/28/2018 128  98 - 192 U/L Final   Performed at [facility], [address] [place], [place] [address]  . Ferritin 05/28/2018 45  11 - 307 ng/mL Final   Performed at [facility], [address], [place], [place] [address]  . Total Protein ELP 05/28/2018 6.8  6.0 - 8.5 g/dL Final  . Albumin ELP 05/28/2018 4.3  2.9 - 4.4 g/dL Final  . Alpha-1-Globulin 05/28/2018 0.3  0.0 - 0.4 g/dL Final  . Alpha-2-Globulin 05/28/2018 0.7  0.4 - 1.0 g/dL Final  . Beta Globulin 05/28/2018 1.0  0.7 - 1.3 g/dL Final  . Gamma Globulin 05/28/2018 0.6  0.4 - 1.8 g/dL Final  . M-Spike, % 05/28/2018 Not Observed  Not Observed g/dL Final  . SPE Interp. 05/28/2018 Comment   Final   Comment: (NOTE) The SPE pattern appears essentially unremarkable. Evidence of monoclonal protein is not apparent. Performed At: [facility] [phone]  [place], [place] [id number] Rush Farmer MD RX:[id number]   . Comment 05/28/2018 Comment   Final   Comment: (NOTE) Protein electrophoresis scan will follow via computer, mail,  or courier delivery.   Marland Kitchen GLOBULIN, TOTAL 05/28/2018 2.5  2.2 - 3.9 g/dL Corrected  . A/G Ratio 05/28/2018 1.7  0.7 - 1.7 Corrected  . Folate 05/28/2018 10.0  >5.9 ng/mL Final   Performed at [facility], [address] [place], [place] [address]  . Methylmalonic Acid, Quantitative 05/28/2018 202  0 - 378 nmol/L Final  . Disclaimer: 05/28/2018 Comment   Final   Comment: (NOTE) This test was developed and its performance characteristics determined by [facility]. It has not been cleared or approved by the Food and Drug Administration. Performed At: [facility] [place], [place] [id number] Rush Farmer MD XI:[id number]   . Sed Rate 05/28/2018 8  0 - 22 mm/hr Final   Performed at [facility], [address] [place], [place] [address]  . CRP 05/28/2018 <0.8  <1.0 mg/dL Final   Performed at [place] [address], [place],  [address]  . b2a2 transcript 05/28/2018 Comment  % Final   Comment: (NOTE)           <0.0032 % (sensitivity limit of assay)   . b3a2 transcript 05/28/2018 Comment  % Final   Comment: (NOTE)           <0.0032 % (sensitivity limit of assay)   . E1A2 Transcript 05/28/2018 Comment  % Final   Comment: (NOTE)           <0.0032 % (sensitivity limit of assay)   . Interpretation (BCRAL): 05/28/2018 Comment   Final   Comment: (NOTE) NEGATIVE for the BCR-ABL1 e1a2 (p190), e13a2 (b2a2, p210) and e14a2 (b3a2, p210) fusion transcripts. These results do not rule out the presence of rare BCR-ABL1 transcripts not detected by this assay.   . Director Review [facility]): 05/28/2018 Comment   Final   Comment: (NOTE) Constance Goltz, PhD, [facility]               Director, [place] for [place] and [place], [place]   . Background: 05/28/2018 Comment   Corrected   Comment: (NOTE) This assay can detect three different types of  BCR-ABL1 fusion transcripts associated with CML, ALL, and AML: e13a2 (previously b2a2) and e14a2 (previously b3a2) (major breakpoint, p210), as well as e1a2 (minor breakpoint, p190). The e13a2 and e14a2 transcript values are titrated to the current International Scale (IS). The standardized baseline is 100% BCR-ABL1 (IS) and major molecular response (MMR) is equivalent to 0.1% BCR-ABL1 (IS) corresponding to a 3-log reduction. Results should be correlated with appropriate clinical and laboratory information as indicated.   . Methodology 05/28/2018 Comment   Corrected   Comment: (NOTE) Total RNA is isolated from the sample and subject to a real-time, reverse transcriptase polymerase chain reaction (RT-PCR). The PCR primers and probes are specific for BCR-ABL1 e13a2, e14a2 and e1a2 fusion transcripts. The ABL1 transcript is amplified as the control for cDNA quantity and quality. Serial dilutions of a validated positive control RNA with known t(9;22)  BCR-ABL1 are used as reference for quantification of BCR-ABL1 relative to ABL1. The numeric BCR-ABL1 level is reportd as % BCR-ABL1/ABL1 and the detection sensitivity is 4.5 log below the standard baseline. This test was developed and its performance characteristics determined by [facility]. It has not been cleared or approved by the Food and Drug Administration. References:    1. Anastasia Fiedler and Branford S: Seminars in Hematology 2003;       40 (suppl2):62-68.    2. White HE, et al. Blood 2010; 116: e111-117.    3. NCCN Clinical Practice Guidelines in Oncology, Chronic       Myeloid Leukemia. V2. 2017.                           Performed At: [facility] [address] [place], [place] [id number] Nechama Guard MD HL:[id number] Performed At: [facility] [place], [place] [id number] Nechama Guard MD LX:[id number]   . JAK2 GenotypR 05/28/2018 Comment   Final   Comment: (NOTE) Result: NEGATIVE for the JAK2  V617F mutation. Interpretation:  The G to T nucleotide change encoding the V617F mutation was not detected.  This result does not rule out the presence of the JAK2 mutation at a level below the sensitivity of detection of this assay, or the presence of other mutations within JAK2 not detected by this assay.  This result does not rule out a diagnosis of polycythemia vera, essential thrombocythemia or idiopathic myelofibrosis as the V617F mutation is not detected in all patients with these disorders.   . Director Review, JAK2 05/28/2018 Comment   Corrected   Comment: (NOTE) Constance Goltz, PhD, [facility]               Director, [place] for [place], [place]               [phone] This test was developed and its performance characteristics determined by [facility]. It has not been cleared or approved by the Food and Drug Administration. Performed At: [facility] [address] [place], [place] [id number] Nechama Guard MD IW:[id number] Performed At: [facility] [place], [place] [id number] Nechama Guard MD QB:[id number]   . BACKGROUND: 05/28/2018 Comment   Corrected   Comment: (NOTE) JAK2 is a cytoplasmic tyrosine kinase with a key role in signal transduction from multiple hematopoietic growth factor receptors. A point mutation within exon 14 of the JAK2 gene ([id number]) encoding a valine to phenylalanine substitution at position 617 of the JAK2 protein (V617F) has been identified in most patients with polycythemia vera, and in about half of those with either essential thrombocythemia or idiopathic myelofibrosis. The V617F has also been detected, although infrequently, in other myeloid disorders such as chronic myelomonocytic leukemia and chronic neutrophilic luekemia. V617F is an acquired mutation that alters a highly conserved valine present in  the negative regulatory JH2 domain of the JAK2 protein and is predicted to dysregulate kinase activity. Methodology: Total genomic DNA was extracted and subjected to TaqMan real-time PCR amplification/detection. Two amplification products per sample were monitored by real-time PCR using primers/probes s  pecific to JAK2 wild type (WT) and JAK2 mutant V617F. The ABI7900 Absolute Quantitation software will compare the patient specimen valuse to the standard curves and generate percent values for wild type and mutant type. In vitro studies have indicated that this assay has an analytical sensitivity of 1%. References: Baxter EJ, Scott Phineas Real, et al. Acquired mutation of the tyrosine kinase JAK2 in human myeloproliferative disorders. Lancet. 2005 Mar 19-25; 365(9464):1054-1061. Alfonso Ramus Couedic JP. A unique clonal JAK2 mutation leading to constitutive signaling causes polycythaemia vera. Nature. 2005 Apr 28; 434(7037):1144-1148. Kralovics R, Passamonti F, Buser AS, et al. A gain-of-function mutation of JAK2 in myeloproliferative disorders. N Engl J Med. 2005 Apr 28; 352(17):1779-1790.   Marland Kitchen Rhuematoid fact SerPl-aCnc 05/28/2018 <10.0  0.0 - 13.9 IU/mL Final   Comment: (NOTE) Performed At: [facility] [place], [place] [id number] Rush Farmer MD PI:[id number]      Pathology Orders Placed This Encounter  Procedures  . CBC with Differential/Platelet    Standing Status:   Future    Standing Expiration Date:   12/16/2019  . Comprehensive metabolic panel    Standing Status:   Future    Standing Expiration Date:   12/16/2019  . Lactate dehydrogenase    Standing Status:   Future    Standing Expiration Date:   12/16/2019       Zoila Shutter MD

## 2018-06-16 NOTE — Patient Instructions (Signed)
[facility]  Discharge Instructions: You saw Dr. Higgs today                               _______________________________________________________________  Thank you for choosing [facility] to provide your oncology and hematology care.  To afford each patient quality time with our providers, please arrive at least 15 minutes before your scheduled appointment.  You need to re-schedule your appointment if you arrive 10 or more minutes late.  We strive to give you quality time with our providers, and arriving late affects you and other patients whose appointments are after yours.  Also, if you no show three or more times for appointments you may be dismissed from the clinic.  Again, thank you for choosing [facility]. Our hope is that these requests will allow you access to exceptional care and in a timely manner. _______________________________________________________________  If you have questions after your visit, please contact our office at [phone] between the hours of 8:30 a.m. and 5:00 p.m. Voicemails left after 4:30 p.m. will not be returned until the following business day. _______________________________________________________________  For prescription refill requests, have your pharmacy contact our office. _______________________________________________________________  Recommendations made by the consultant and any test results will be sent to your referring physician. _______________________________________________________________ 

## 2018-07-15 DIAGNOSIS — Z713 Dietary counseling and surveillance: Secondary | ICD-10-CM | POA: Diagnosis not present

## 2018-09-09 DIAGNOSIS — Z713 Dietary counseling and surveillance: Secondary | ICD-10-CM | POA: Diagnosis not present

## 2018-09-09 DIAGNOSIS — K080 Exfoliation of teeth due to systemic causes: Secondary | ICD-10-CM | POA: Diagnosis not present

## 2018-12-16 ENCOUNTER — Other Ambulatory Visit (HOSPITAL_COMMUNITY): Payer: Federal, State, Local not specified - PPO

## 2018-12-23 ENCOUNTER — Ambulatory Visit (HOSPITAL_COMMUNITY): Payer: Federal, State, Local not specified - PPO | Admitting: Hematology

## 2019-03-18 ENCOUNTER — Other Ambulatory Visit (HOSPITAL_COMMUNITY): Payer: Self-pay | Admitting: *Deleted

## 2019-03-18 ENCOUNTER — Other Ambulatory Visit: Payer: Self-pay | Admitting: Adult Health

## 2019-03-18 DIAGNOSIS — Z1283 Encounter for screening for malignant neoplasm of skin: Secondary | ICD-10-CM | POA: Diagnosis not present

## 2019-03-18 DIAGNOSIS — D7589 Other specified diseases of blood and blood-forming organs: Secondary | ICD-10-CM

## 2019-03-18 DIAGNOSIS — Z1231 Encounter for screening mammogram for malignant neoplasm of breast: Secondary | ICD-10-CM

## 2019-03-18 DIAGNOSIS — D225 Melanocytic nevi of trunk: Secondary | ICD-10-CM | POA: Diagnosis not present

## 2019-03-18 DIAGNOSIS — D75839 Thrombocytosis, unspecified: Secondary | ICD-10-CM

## 2019-03-18 DIAGNOSIS — D473 Essential (hemorrhagic) thrombocythemia: Secondary | ICD-10-CM

## 2019-03-18 DIAGNOSIS — L308 Other specified dermatitis: Secondary | ICD-10-CM | POA: Diagnosis not present

## 2019-03-19 ENCOUNTER — Inpatient Hospital Stay (HOSPITAL_COMMUNITY): Payer: Federal, State, Local not specified - PPO | Attending: Hematology

## 2019-03-19 ENCOUNTER — Other Ambulatory Visit: Payer: Self-pay

## 2019-03-19 DIAGNOSIS — Z23 Encounter for immunization: Secondary | ICD-10-CM | POA: Diagnosis not present

## 2019-03-19 DIAGNOSIS — D721 Eosinophilia: Secondary | ICD-10-CM | POA: Insufficient documentation

## 2019-03-19 DIAGNOSIS — F17210 Nicotine dependence, cigarettes, uncomplicated: Secondary | ICD-10-CM | POA: Diagnosis not present

## 2019-03-19 DIAGNOSIS — D473 Essential (hemorrhagic) thrombocythemia: Secondary | ICD-10-CM | POA: Diagnosis not present

## 2019-03-19 DIAGNOSIS — Z803 Family history of malignant neoplasm of breast: Secondary | ICD-10-CM | POA: Diagnosis not present

## 2019-03-19 DIAGNOSIS — Z79899 Other long term (current) drug therapy: Secondary | ICD-10-CM | POA: Insufficient documentation

## 2019-03-19 DIAGNOSIS — Z801 Family history of malignant neoplasm of trachea, bronchus and lung: Secondary | ICD-10-CM | POA: Insufficient documentation

## 2019-03-19 DIAGNOSIS — Z833 Family history of diabetes mellitus: Secondary | ICD-10-CM | POA: Diagnosis not present

## 2019-03-19 DIAGNOSIS — D7589 Other specified diseases of blood and blood-forming organs: Secondary | ICD-10-CM | POA: Diagnosis not present

## 2019-03-19 DIAGNOSIS — Z8349 Family history of other endocrine, nutritional and metabolic diseases: Secondary | ICD-10-CM | POA: Insufficient documentation

## 2019-03-19 DIAGNOSIS — Z83438 Family history of other disorder of lipoprotein metabolism and other lipidemia: Secondary | ICD-10-CM | POA: Insufficient documentation

## 2019-03-19 DIAGNOSIS — R21 Rash and other nonspecific skin eruption: Secondary | ICD-10-CM | POA: Insufficient documentation

## 2019-03-19 DIAGNOSIS — D75839 Thrombocytosis, unspecified: Secondary | ICD-10-CM

## 2019-03-19 DIAGNOSIS — Z8249 Family history of ischemic heart disease and other diseases of the circulatory system: Secondary | ICD-10-CM | POA: Insufficient documentation

## 2019-03-19 DIAGNOSIS — Z836 Family history of other diseases of the respiratory system: Secondary | ICD-10-CM | POA: Insufficient documentation

## 2019-03-19 LAB — COMPREHENSIVE METABOLIC PANEL
ALT: 16 U/L (ref 0–44)
AST: 19 U/L (ref 15–41)
Albumin: 4.2 g/dL (ref 3.5–5.0)
Alkaline Phosphatase: 54 U/L (ref 38–126)
Anion gap: 8 (ref 5–15)
BUN: 7 mg/dL (ref 6–20)
CO2: 27 mmol/L (ref 22–32)
Calcium: 9.4 mg/dL (ref 8.9–10.3)
Chloride: 103 mmol/L (ref 98–111)
Creatinine, Ser: 0.56 mg/dL (ref 0.44–1.00)
GFR calc Af Amer: >60 mL/min (ref >60)
GFR calc non Af Amer: >60 mL/min (ref >60)
Glucose, Bld: 92 mg/dL (ref 70–99)
Potassium: 4.2 mmol/L (ref 3.5–5.1)
Sodium: 138 mmol/L (ref 135–145)
Total Bilirubin: 0.1 mg/dL — ABNORMAL LOW (ref 0.3–1.2)
Total Protein: 6.9 g/dL (ref 6.5–8.1)

## 2019-03-19 LAB — CBC WITH DIFFERENTIAL/PLATELET
Abs Immature Granulocytes: 0.03 10*3/uL (ref 0.00–0.07)
Basophils Absolute: 0.1 10*3/uL (ref 0.0–0.1)
Basophils Relative: 1 %
Eosinophils Absolute: 1.9 10*3/uL — ABNORMAL HIGH (ref 0.0–0.5)
Eosinophils Relative: 21 %
HCT: 39.1 % (ref 36.0–46.0)
Hemoglobin: 13.2 g/dL (ref 12.0–15.0)
Immature Granulocytes: 0 %
Lymphocytes Relative: 31 %
Lymphs Abs: 2.9 10*3/uL (ref 0.7–4.0)
MCH: 34.4 pg — ABNORMAL HIGH (ref 26.0–34.0)
MCHC: 33.8 g/dL (ref 30.0–36.0)
MCV: 101.8 fL — ABNORMAL HIGH (ref 80.0–100.0)
Monocytes Absolute: 0.6 10*3/uL (ref 0.1–1.0)
Monocytes Relative: 7 %
Neutro Abs: 3.8 10*3/uL (ref 1.7–7.7)
Neutrophils Relative %: 40 %
Platelets: 503 10*3/uL — ABNORMAL HIGH (ref 150–400)
RBC: 3.84 MIL/uL — ABNORMAL LOW (ref 3.87–5.11)
RDW: 12.1 % (ref 11.5–15.5)
WBC: 9.3 10*3/uL (ref 4.0–10.5)
nRBC: 0 % (ref 0.0–0.2)

## 2019-03-19 LAB — LACTATE DEHYDROGENASE: LDH: 150 U/L (ref 98–192)

## 2019-03-26 ENCOUNTER — Ambulatory Visit (HOSPITAL_COMMUNITY): Payer: Federal, State, Local not specified - PPO | Admitting: Hematology

## 2019-03-30 ENCOUNTER — Inpatient Hospital Stay (HOSPITAL_COMMUNITY): Payer: Federal, State, Local not specified - PPO | Admitting: Hematology

## 2019-03-30 ENCOUNTER — Encounter (HOSPITAL_COMMUNITY): Payer: Self-pay | Admitting: Hematology

## 2019-03-30 ENCOUNTER — Other Ambulatory Visit: Payer: Self-pay

## 2019-03-30 VITALS — BP 134/70 | HR 76 | Temp 97.3°F | Resp 18 | Wt 90.0 lb

## 2019-03-30 DIAGNOSIS — D7589 Other specified diseases of blood and blood-forming organs: Secondary | ICD-10-CM | POA: Diagnosis not present

## 2019-03-30 DIAGNOSIS — Z8349 Family history of other endocrine, nutritional and metabolic diseases: Secondary | ICD-10-CM | POA: Diagnosis not present

## 2019-03-30 DIAGNOSIS — D721 Eosinophilia: Secondary | ICD-10-CM | POA: Diagnosis not present

## 2019-03-30 DIAGNOSIS — Z803 Family history of malignant neoplasm of breast: Secondary | ICD-10-CM | POA: Diagnosis not present

## 2019-03-30 DIAGNOSIS — D473 Essential (hemorrhagic) thrombocythemia: Secondary | ICD-10-CM

## 2019-03-30 DIAGNOSIS — Z23 Encounter for immunization: Secondary | ICD-10-CM | POA: Diagnosis not present

## 2019-03-30 DIAGNOSIS — Z8249 Family history of ischemic heart disease and other diseases of the circulatory system: Secondary | ICD-10-CM | POA: Diagnosis not present

## 2019-03-30 DIAGNOSIS — Z0000 Encounter for general adult medical examination without abnormal findings: Secondary | ICD-10-CM

## 2019-03-30 DIAGNOSIS — R21 Rash and other nonspecific skin eruption: Secondary | ICD-10-CM | POA: Diagnosis not present

## 2019-03-30 DIAGNOSIS — Z833 Family history of diabetes mellitus: Secondary | ICD-10-CM | POA: Diagnosis not present

## 2019-03-30 DIAGNOSIS — Z836 Family history of other diseases of the respiratory system: Secondary | ICD-10-CM | POA: Diagnosis not present

## 2019-03-30 DIAGNOSIS — Z801 Family history of malignant neoplasm of trachea, bronchus and lung: Secondary | ICD-10-CM | POA: Diagnosis not present

## 2019-03-30 DIAGNOSIS — F17210 Nicotine dependence, cigarettes, uncomplicated: Secondary | ICD-10-CM | POA: Diagnosis not present

## 2019-03-30 DIAGNOSIS — Z79899 Other long term (current) drug therapy: Secondary | ICD-10-CM | POA: Diagnosis not present

## 2019-03-30 DIAGNOSIS — Z83438 Family history of other disorder of lipoprotein metabolism and other lipidemia: Secondary | ICD-10-CM | POA: Diagnosis not present

## 2019-03-30 DIAGNOSIS — D75839 Thrombocytosis, unspecified: Secondary | ICD-10-CM

## 2019-03-30 MED ORDER — INFLUENZA VAC SPLIT QUAD 0.5 ML IM SUSY
0.5000 mL | PREFILLED_SYRINGE | Freq: Once | INTRAMUSCULAR | Status: AC
  Administered 2019-03-30: 0.5 mL via INTRAMUSCULAR

## 2019-03-30 MED ORDER — INFLUENZA VAC SPLIT QUAD 0.5 ML IM SUSY
PREFILLED_SYRINGE | INTRAMUSCULAR | Status: AC
  Filled 2019-03-30: qty 0.5

## 2019-03-30 NOTE — Progress Notes (Signed)
Sherri Jackson,  [id number]   CLINIC:  Medical Oncology/Hematology  PCP:  Patient, No Pcp Per No address on file None   REASON FOR VISIT:  Follow-up for thrombocytosis, macrocytosis, eosinophilia.  CURRENT THERAPY: Under work-up.    INTERVAL HISTORY:  Sherri Jackson 53 y.o. female seen for follow-up of elevated platelet count, macrocytosis and eosinophilia.  Patient reports skin rash in the lower back, behind the knees and at the elbows in the past couple of months, which subsided completely 2 weeks ago.  She applied creams.  Denies any fevers, night sweats or weight loss in the last 6 months.  Appetite is 75%.  Energy levels are 75%.  Denies any nausea, vomiting, diarrhea or constipation.  No bleeding per rectum or melena.    REVIEW OF SYSTEMS:  Review of Systems  All other systems reviewed and are negative.    PAST MEDICAL/SURGICAL HISTORY:  Past Medical History:  Diagnosis Date  . Elevated MCV 03/20/2016   Will check B12 and folate  . Enlarged thyroid 12/01/2013  . Peri-menopausal 12/01/2013  . Vaginal Pap smear, abnormal   . Vitamin D deficiency 03/20/2016   Past Surgical History:  Procedure Laterality Date  . ANKLE SURGERY Left   . APPENDECTOMY    . HYSTEROSCOPY W/ ENDOMETRIAL ABLATION  2004  . laser conization of cervix  2004     SOCIAL HISTORY:  Social History   Socioeconomic History  . Marital status: Married    Spouse name: Not on file  . Number of children: Not on file  . Years of education: Not on file  . Highest education level: Not on file  Occupational History  . Not on file  Social Needs  . Financial resource strain: Not on file  . Food insecurity    Worry: Not on file    Inability: Not on file  . Transportation needs    Medical: Not on file    Non-medical: Not on file  Tobacco Use  . Smoking status: Current Every Day Smoker    Packs/day: 0.50    Years: 33.00    Pack years: 16.50    Types:  Cigarettes  . Smokeless tobacco: Never Used  Substance and Sexual Activity  . Alcohol use: Yes    Comment: occ  . Drug use: No  . Sexual activity: Yes    Birth control/protection: Post-menopausal  Lifestyle  . Physical activity    Days per week: Not on file    Minutes per session: Not on file  . Stress: Not on file  Relationships  . Social Herbalist on phone: Not on file    Gets together: Not on file    Attends religious service: Not on file    Active member of club or organization: Not on file    Attends meetings of clubs or organizations: Not on file    Relationship status: Not on file  . Intimate partner violence    Fear of current or ex partner: Not on file    Emotionally abused: Not on file    Physically abused: Not on file    Forced sexual activity: Not on file  Other Topics Concern  . Not on file  Social History Narrative  . Not on file    FAMILY HISTORY:  Family History  Problem Relation Age of Onset  . Cancer Father        lung  . COPD Father   .  Emphysema Father   . Hyperlipidemia Father   . Hypertension Father   . Thyroid disease Father   . Heart disease Paternal Grandmother   . Heart attack Paternal Grandfather   . Diabetes Paternal Uncle   . Breast cancer Cousin   . Breast cancer Cousin     CURRENT MEDICATIONS:  Outpatient Encounter Medications as of 03/30/2019  Medication Sig  . cetirizine (ZYRTEC) 10 MG tablet Take 10 mg by mouth daily.  Marland Kitchen triamcinolone cream (KENALOG) 0.1 % Apply 1 application topically 2 (two) times daily.   . Cholecalciferol 5000 units capsule Take 1 capsule (5,000 Units total) by mouth daily. (Patient not taking: Reported on 03/30/2019)  . ibuprofen (ADVIL,MOTRIN) 200 MG tablet Take 400 mg by mouth as needed.  Marland Kitchen LORazepam (ATIVAN) 0.5 MG tablet Take 1 every 12 hours as needed (Patient not taking: Reported on 03/30/2019)  . Multiple Vitamin (MULTIVITAMIN) tablet Take 1 tablet by mouth daily.  . [EXPIRED] influenza  vac split quadrivalent PF (FLUARIX) injection 0.5 mL    No facility-administered encounter medications on file as of 03/30/2019.     ALLERGIES:  No Known Allergies   PHYSICAL EXAM:  ECOG Performance status: 0  Vitals:   03/30/19 1533  BP: 134/70  Pulse: 76  Resp: 18  Temp: (!) 97.3 F (36.3 C)  SpO2: 100%   Filed Weights   03/30/19 1533  Weight: 90 lb (40.8 kg)    Physical Exam Vitals signs reviewed.  Constitutional:      Appearance: Normal appearance.  Cardiovascular:     Rate and Rhythm: Normal rate and regular rhythm.     Heart sounds: Normal heart sounds.  Pulmonary:     Effort: Pulmonary effort is normal.     Breath sounds: Normal breath sounds.  Abdominal:     General: There is no distension.     Palpations: Abdomen is soft. There is no mass.  Musculoskeletal:        General: No swelling.  Skin:    General: Skin is warm.  Neurological:     General: No focal deficit present.     Mental Status: She is alert and oriented to person, place, and time.  Psychiatric:        Mood and Affect: Mood normal.        Behavior: Behavior normal.    No palpable splenomegaly or adenopathy.  LABORATORY DATA:  I have reviewed the labs as listed.  CBC    Component Value Date/Time   WBC 9.3 03/19/2019 1209   RBC 3.84 (L) 03/19/2019 1209   HGB 13.2 03/19/2019 1209   HGB 14.1 05/21/2018 1020   HCT 39.1 03/19/2019 1209   HCT 40.7 05/21/2018 1020   PLT 503 (H) 03/19/2019 1209   PLT 571 (H) 05/21/2018 1020   MCV 101.8 (H) 03/19/2019 1209   MCV 100 (H) 05/21/2018 1020   MCH 34.4 (H) 03/19/2019 1209   MCHC 33.8 03/19/2019 1209   RDW 12.1 03/19/2019 1209   RDW 11.6 (L) 05/21/2018 1020   LYMPHSABS 2.9 03/19/2019 1209   MONOABS 0.6 03/19/2019 1209   EOSABS 1.9 (H) 03/19/2019 1209   BASOSABS 0.1 03/19/2019 1209   CMP Latest Ref Rng & Units 03/19/2019 05/28/2018 05/21/2018  Glucose 70 - 99 mg/dL 92 81 86  BUN 6 - 20 mg/dL 7 9 8   Creatinine 0.44 - 1.00 mg/dL 0.56 0.50  0.50(L)  Sodium 135 - 145 mmol/L 138 137 140  Potassium 3.5 - 5.1 mmol/L 4.2 4.2 4.8  Chloride 98 - 111 mmol/L 103 105 101  CO2 22 - 32 mmol/L 27 25 23   Calcium 8.9 - 10.3 mg/dL 9.4 9.4 10.3(H)  Total Protein 6.5 - 8.1 g/dL 6.9 7.4 7.0  Total Bilirubin 0.3 - 1.2 mg/dL 0.1(L) 0.5 0.3  Alkaline Phos 38 - 126 U/L 54 51 60  AST 15 - 41 U/L 19 19 18   ALT 0 - 44 U/L 16 17 15        DIAGNOSTIC IMAGING:  I have independently reviewed the scans and discussed with the patient.    ASSESSMENT & PLAN:   Thrombocytosis ([place]) 1.  Thrombocytosis: - Testing was negative for Jak 2 V6 69F mutation and BCR/ABL. - She does have a long history of thrombocytosis with platelet count mostly between 500-600. -Never had a history of thrombosis.  No connective tissue disorders.  Physical exam does not reveal splenomegaly. - I have recommended testing for other mutations including CALR, Jak2 exon 12-15, MPL.  This will be done prior to her visit in 2 months.  2.  Eosinophilia: -Absolute eosinophil count is 1900.  This was 1600 a year ago. -Patient reports eczematous skin rash on the back, behind the knees and in the elbows.  This got better with steroid creams about couple of weeks ago. -I plan to repeat CBC with differential prior to next visit.  3.  Macrocytosis: - She does have MCV of 101.8 with normal hemoglobin.  Previous testing for vitamin 123456 and folic acid were negative.  TSH was also normal in November 2019.      Orders placed this encounter:  Orders Placed This Encounter  Procedures  . CBC with Differential/Platelet  . JAK2 Exons 12-15  . Calreticulin (CALR) Mutation Analysis  . MPL mutation analysis  . Lactate dehydrogenase   Total time spent is 25 minutes with more than 50% of the time spent face-to-face discussing further work-up, counseling and coordination of care.   Derek Jack, MD [place] [phone]

## 2019-03-30 NOTE — Assessment & Plan Note (Signed)
1.  Thrombocytosis: - Testing was negative for Jak 2 V6 26F mutation and BCR/ABL. - She does have a long history of thrombocytosis with platelet count mostly between 500-600. -Never had a history of thrombosis.  No connective tissue disorders.  Physical exam does not reveal splenomegaly. - I have recommended testing for other mutations including CALR, Jak2 exon 12-15, MPL.  This will be done prior to her visit in 2 months.  2.  Eosinophilia: -Absolute eosinophil count is 1900.  This was 1600 a year ago. -Patient reports eczematous skin rash on the back, behind the knees and in the elbows.  This got better with steroid creams about couple of weeks ago. -I plan to repeat CBC with differential prior to next visit.  3.  Macrocytosis: - She does have MCV of 101.8 with normal hemoglobin.  Previous testing for vitamin 123456 and folic acid were negative.  TSH was also normal in November 2019.

## 2019-03-30 NOTE — Patient Instructions (Addendum)
[facility] at [facility] Discharge Instructions  You were seen today by Dr. Katragadda. He went over your recent lab results. He will see you back in 2 months for labs and follow up.   Thank you for choosing [facility] at [facility] to provide your oncology and hematology care.  To afford each patient quality time with our provider, please arrive at least 15 minutes before your scheduled appointment time.   If you have a lab appointment with the Cancer Center please come in thru the  Main Entrance and check in at the main information desk  You need to re-schedule your appointment should you arrive 10 or more minutes late.  We strive to give you quality time with our providers, and arriving late affects you and other patients whose appointments are after yours.  Also, if you no show three or more times for appointments you may be dismissed from the clinic at the providers discretion.     Again, thank you for choosing [facility].  Our hope is that these requests will decrease the amount of time that you wait before being seen by our physicians.       _____________________________________________________________  Should you have questions after your visit to  Cancer Center, please contact our office at [phone] between the hours of 8:00 a.m. and 4:30 p.m.  Voicemails left after 4:00 p.m. will not be returned until the following business day.  For prescription refill requests, have your pharmacy contact our office and allow 72 hours.    Cancer Center Support Programs:   > Cancer Support Group  2nd Tuesday of the month 1pm-2pm, Journey Room    

## 2019-05-06 ENCOUNTER — Other Ambulatory Visit: Payer: Self-pay | Admitting: *Deleted

## 2019-05-06 DIAGNOSIS — Z20822 Contact with and (suspected) exposure to covid-19: Secondary | ICD-10-CM

## 2019-05-07 LAB — SPECIMEN STATUS REPORT

## 2019-05-07 LAB — NOVEL CORONAVIRUS, NAA: SARS-CoV-2, NAA: NOT DETECTED

## 2019-05-13 ENCOUNTER — Ambulatory Visit
Admission: RE | Admit: 2019-05-13 | Discharge: 2019-05-13 | Disposition: A | Discharge status: Home / Self Care | Payer: Federal, State, Local not specified - PPO | Source: Ambulatory Visit | Attending: Adult Health | Admitting: Adult Health

## 2019-05-13 ENCOUNTER — Other Ambulatory Visit: Payer: Self-pay

## 2019-05-13 DIAGNOSIS — Z1231 Encounter for screening mammogram for malignant neoplasm of breast: Secondary | ICD-10-CM

## 2019-06-01 ENCOUNTER — Other Ambulatory Visit: Payer: Self-pay

## 2019-06-01 ENCOUNTER — Inpatient Hospital Stay (HOSPITAL_COMMUNITY): Payer: Federal, State, Local not specified - PPO | Attending: Hematology

## 2019-06-01 DIAGNOSIS — D7589 Other specified diseases of blood and blood-forming organs: Secondary | ICD-10-CM | POA: Diagnosis not present

## 2019-06-01 DIAGNOSIS — Z20828 Contact with and (suspected) exposure to other viral communicable diseases: Secondary | ICD-10-CM | POA: Diagnosis not present

## 2019-06-01 LAB — CBC WITH DIFFERENTIAL/PLATELET
Abs Immature Granulocytes: 0.02 10*3/uL (ref 0.00–0.07)
Basophils Absolute: 0.1 10*3/uL (ref 0.0–0.1)
Basophils Relative: 1 %
Eosinophils Absolute: 1.7 10*3/uL — ABNORMAL HIGH (ref 0.0–0.5)
Eosinophils Relative: 17 %
HCT: 38.9 % (ref 36.0–46.0)
Hemoglobin: 12.9 g/dL (ref 12.0–15.0)
Immature Granulocytes: 0 %
Lymphocytes Relative: 31 %
Lymphs Abs: 3.1 10*3/uL (ref 0.7–4.0)
MCH: 33.4 pg (ref 26.0–34.0)
MCHC: 33.2 g/dL (ref 30.0–36.0)
MCV: 100.8 fL — ABNORMAL HIGH (ref 80.0–100.0)
Monocytes Absolute: 0.5 10*3/uL (ref 0.1–1.0)
Monocytes Relative: 5 %
Neutro Abs: 4.6 10*3/uL (ref 1.7–7.7)
Neutrophils Relative %: 46 %
Platelets: 517 10*3/uL — ABNORMAL HIGH (ref 150–400)
RBC: 3.86 MIL/uL — ABNORMAL LOW (ref 3.87–5.11)
RDW: 12.4 % (ref 11.5–15.5)
WBC: 9.9 10*3/uL (ref 4.0–10.5)
nRBC: 0 % (ref 0.0–0.2)

## 2019-06-01 LAB — LACTATE DEHYDROGENASE: LDH: 162 U/L (ref 98–192)

## 2019-06-05 ENCOUNTER — Other Ambulatory Visit: Payer: Self-pay

## 2019-06-05 LAB — JAK2 EXONS 12-15

## 2019-06-08 ENCOUNTER — Inpatient Hospital Stay (HOSPITAL_COMMUNITY): Payer: Federal, State, Local not specified - PPO | Attending: Hematology | Admitting: Hematology

## 2019-06-08 ENCOUNTER — Encounter (HOSPITAL_COMMUNITY): Payer: Self-pay | Admitting: Hematology

## 2019-06-08 ENCOUNTER — Other Ambulatory Visit: Payer: Self-pay

## 2019-06-08 DIAGNOSIS — Z801 Family history of malignant neoplasm of trachea, bronchus and lung: Secondary | ICD-10-CM | POA: Diagnosis not present

## 2019-06-08 DIAGNOSIS — D473 Essential (hemorrhagic) thrombocythemia: Secondary | ICD-10-CM

## 2019-06-08 DIAGNOSIS — F17210 Nicotine dependence, cigarettes, uncomplicated: Secondary | ICD-10-CM | POA: Insufficient documentation

## 2019-06-08 DIAGNOSIS — Z803 Family history of malignant neoplasm of breast: Secondary | ICD-10-CM | POA: Insufficient documentation

## 2019-06-08 DIAGNOSIS — D721 Eosinophilia, unspecified: Secondary | ICD-10-CM | POA: Diagnosis not present

## 2019-06-08 DIAGNOSIS — L299 Pruritus, unspecified: Secondary | ICD-10-CM | POA: Insufficient documentation

## 2019-06-08 DIAGNOSIS — Z8349 Family history of other endocrine, nutritional and metabolic diseases: Secondary | ICD-10-CM | POA: Diagnosis not present

## 2019-06-08 DIAGNOSIS — D7589 Other specified diseases of blood and blood-forming organs: Secondary | ICD-10-CM | POA: Insufficient documentation

## 2019-06-08 DIAGNOSIS — Z833 Family history of diabetes mellitus: Secondary | ICD-10-CM | POA: Insufficient documentation

## 2019-06-08 DIAGNOSIS — R21 Rash and other nonspecific skin eruption: Secondary | ICD-10-CM | POA: Diagnosis not present

## 2019-06-08 DIAGNOSIS — Z79899 Other long term (current) drug therapy: Secondary | ICD-10-CM | POA: Diagnosis not present

## 2019-06-08 DIAGNOSIS — Z8249 Family history of ischemic heart disease and other diseases of the circulatory system: Secondary | ICD-10-CM | POA: Insufficient documentation

## 2019-06-08 DIAGNOSIS — Z836 Family history of other diseases of the respiratory system: Secondary | ICD-10-CM | POA: Diagnosis not present

## 2019-06-08 DIAGNOSIS — R7989 Other specified abnormal findings of blood chemistry: Secondary | ICD-10-CM | POA: Diagnosis not present

## 2019-06-08 DIAGNOSIS — D75839 Thrombocytosis, unspecified: Secondary | ICD-10-CM

## 2019-06-08 DIAGNOSIS — Z83438 Family history of other disorder of lipoprotein metabolism and other lipidemia: Secondary | ICD-10-CM | POA: Insufficient documentation

## 2019-06-08 NOTE — Assessment & Plan Note (Addendum)
1.  Thrombocytosis: - Testing was negative for Jak 2 V6 80F mutation and BCR/ABL. - She does have a long history of thrombocytosis with platelet count mostly between 500-600. -Never had a history of thrombosis.  No connective tissue disorders.  Physical exam does not reveal splenomegaly. -JAK2 exon 12 through 15 were negative.  MPL and CALR mutations were not assessed. -CBC showed stable platelets at 517,000. -We will continue to monitor no interventions are indicated at this time. Return to clinic in 6 months.  2.  Eosinophilia: -Absolute eosinophil count is 1900.  This was 1600 a year ago. -Patient reports eczematous skin rash on the back, behind the knees and in the elbows.  This got better with steroid creams about couple of weeks ago. -Eosinophil l count remains stable at 1.7.  No intervention indicated at this time.  We will continue to monitor.  3.  Macrocytosis: - She does have MCV of 101.8 with normal hemoglobin.  Previous testing for vitamin 123456 and folic acid were negative.  TSH was also normal in November 2019.  4. Tobacco abuse -Patient is a current everyday smoker smoking a half a pack of cigarettes a day for the past 33 years.  Counseled the patient on the harmful effects of smoking advised her to stop.  Cessation's methods were provided to the patient. -We will recommend low-dose CT lung cancer screening when patient turns 24.

## 2019-06-08 NOTE — Progress Notes (Signed)
Sherri Jackson, [place] [address]   CLINIC:  Medical Oncology/Hematology  PCP:  Patient, No Pcp Per No address on file None   REASON FOR VISIT:  Follow-up for thrombocytosis  CURRENT THERAPY: Clinical surveillance    INTERVAL HISTORY:  Sherri Jackson 53 y.o. female presents today for follow-up.  She reports overall doing well.  She denies any significant fatigue.  She denies any changes in health history since her last visit.  Denies any changes in bowel habits.  Appetite is stable.  No weight loss.  Denies any fevers, chills, night sweats.  She is here for repeat labs and office visit   REVIEW OF SYSTEMS:  Review of Systems  Constitutional: Negative.  Negative for fatigue.  HENT:  Negative.   Eyes: Negative.   Respiratory: Negative.   Cardiovascular: Negative.   Endocrine: Negative.   Genitourinary: Negative.    Musculoskeletal: Negative.   Skin: Positive for itching and rash.  Neurological: Negative.   Hematological: Negative.   Psychiatric/Behavioral: Negative.      PAST MEDICAL/SURGICAL HISTORY:  Past Medical History:  Diagnosis Date  . Elevated MCV 03/20/2016   Will check B12 and folate  . Enlarged thyroid 12/01/2013  . Peri-menopausal 12/01/2013  . Vaginal Pap smear, abnormal   . Vitamin D deficiency 03/20/2016   Past Surgical History:  Procedure Laterality Date  . ANKLE SURGERY Left   . APPENDECTOMY    . HYSTEROSCOPY W/ ENDOMETRIAL ABLATION  2004  . laser conization of cervix  2004     SOCIAL HISTORY:  Social History   Socioeconomic History  . Marital status: Married    Spouse name: Not on file  . Number of children: Not on file  . Years of education: Not on file  . Highest education level: Not on file  Occupational History  . Not on file  Social Needs  . Financial resource strain: Not on file  . Food insecurity    Worry: Not on file    Inability: Not on file  . Transportation needs    Medical: Not on file     Non-medical: Not on file  Tobacco Use  . Smoking status: Current Every Day Smoker    Packs/day: 0.50    Years: 33.00    Pack years: 16.50    Types: Cigarettes  . Smokeless tobacco: Never Used  Substance and Sexual Activity  . Alcohol use: Yes    Comment: occ  . Drug use: No  . Sexual activity: Yes    Birth control/protection: Post-menopausal  Lifestyle  . Physical activity    Days per week: Not on file    Minutes per session: Not on file  . Stress: Not on file  Relationships  . Social Herbalist on phone: Not on file    Gets together: Not on file    Attends religious service: Not on file    Active member of club or organization: Not on file    Attends meetings of clubs or organizations: Not on file    Relationship status: Not on file  . Intimate partner violence    Fear of current or ex partner: Not on file    Emotionally abused: Not on file    Physically abused: Not on file    Forced sexual activity: Not on file  Other Topics Concern  . Not on file  Social History Narrative  . Not on file    FAMILY HISTORY:  Family  History  Problem Relation Age of Onset  . Cancer Father        lung  . COPD Father   . Emphysema Father   . Hyperlipidemia Father   . Hypertension Father   . Thyroid disease Father   . Heart disease Paternal Grandmother   . Heart attack Paternal Grandfather   . Diabetes Paternal Uncle   . Breast cancer Cousin   . Breast cancer Cousin     CURRENT MEDICATIONS:  Outpatient Encounter Medications as of 06/08/2019  Medication Sig  . cetirizine (ZYRTEC) 10 MG tablet Take 10 mg by mouth daily.  . Cholecalciferol 5000 units capsule Take 1 capsule (5,000 Units total) by mouth daily.  . Multiple Vitamin (MULTIVITAMIN) tablet Take 1 tablet by mouth daily.  Marland Kitchen triamcinolone cream (KENALOG) 0.1 % Apply 1 application topically 2 (two) times daily.   Marland Kitchen ibuprofen (ADVIL,MOTRIN) 200 MG tablet Take 400 mg by mouth as needed.  Marland Kitchen LORazepam (ATIVAN) 0.5  MG tablet Take 1 every 12 hours as needed (Patient not taking: Reported on 03/30/2019)   No facility-administered encounter medications on file as of 06/08/2019.     ALLERGIES:  No Known Allergies   PHYSICAL EXAM:  ECOG Performance status: 1  Vitals:   06/08/19 1428  BP: (!) 150/79  Pulse: 81  Resp: 18  Temp: 97.7 F (36.5 C)  SpO2: 98%   Filed Weights   06/08/19 1428  Weight: 89 lb (40.4 kg)    Physical Exam Constitutional:      Appearance: Normal appearance.  HENT:     Head: Normocephalic.     Right Ear: External ear normal.     Left Ear: External ear normal.  Eyes:     Conjunctiva/sclera: Conjunctivae normal.  Neck:     Musculoskeletal: Normal range of motion.  Cardiovascular:     Rate and Rhythm: Normal rate and regular rhythm.     Pulses: Normal pulses.     Heart sounds: Normal heart sounds.  Pulmonary:     Effort: Pulmonary effort is normal.     Breath sounds: Normal breath sounds.  Abdominal:     General: Bowel sounds are normal.  Musculoskeletal: Normal range of motion.  Neurological:     General: No focal deficit present.     Mental Status: She is alert and oriented to person, place, and time.  Psychiatric:        Mood and Affect: Mood normal.        Behavior: Behavior normal.      LABORATORY DATA:  I have reviewed the labs as listed.  CBC    Component Value Date/Time   WBC 9.9 06/01/2019 1227   RBC 3.86 (L) 06/01/2019 1227   HGB 12.9 06/01/2019 1227   HGB 14.1 05/21/2018 1020   HCT 38.9 06/01/2019 1227   HCT 40.7 05/21/2018 1020   PLT 517 (H) 06/01/2019 1227   PLT 571 (H) 05/21/2018 1020   MCV 100.8 (H) 06/01/2019 1227   MCV 100 (H) 05/21/2018 1020   MCH 33.4 06/01/2019 1227   MCHC 33.2 06/01/2019 1227   RDW 12.4 06/01/2019 1227   RDW 11.6 (L) 05/21/2018 1020   LYMPHSABS 3.1 06/01/2019 1227   MONOABS 0.5 06/01/2019 1227   EOSABS 1.7 (H) 06/01/2019 1227   BASOSABS 0.1 06/01/2019 1227   CMP Latest Ref Rng & Units 03/19/2019  05/28/2018 05/21/2018  Glucose 70 - 99 mg/dL 92 81 86  BUN 6 - 20 mg/dL 7 9 8   Creatinine 0.44 -  1.00 mg/dL 0.56 0.50 0.50(L)  Sodium 135 - 145 mmol/L 138 137 140  Potassium 3.5 - 5.1 mmol/L 4.2 4.2 4.8  Chloride 98 - 111 mmol/L 103 105 101  CO2 22 - 32 mmol/L 27 25 23   Calcium 8.9 - 10.3 mg/dL 9.4 9.4 10.3(H)  Total Protein 6.5 - 8.1 g/dL 6.9 7.4 7.0  Total Bilirubin 0.3 - 1.2 mg/dL 0.1(L) 0.5 0.3  Alkaline Phos 38 - 126 U/L 54 51 60  AST 15 - 41 U/L 19 19 18   ALT 0 - 44 U/L 16 17 15            ASSESSMENT & PLAN:   Thrombocytosis ([facility]) 1.  Thrombocytosis: - Testing was negative for Jak 2 V6 49F mutation and BCR/ABL. - She does have a long history of thrombocytosis with platelet count mostly between 500-600. -Never had a history of thrombosis.  No connective tissue disorders.  Physical exam does not reveal splenomegaly. -JAK2 exon 12 through 15 were negative.  MPL and CALR mutations were not assessed. -CBC showed stable platelets at 517,000. -We will continue to monitor no interventions are indicated at this time. Return to clinic in 6 months.  2.  Eosinophilia: -Absolute eosinophil count is 1900.  This was 1600 a year ago. -Patient reports eczematous skin rash on the back, behind the knees and in the elbows.  This got better with steroid creams about couple of weeks ago. -Eosinophil l count remains stable at 1.7.  No intervention indicated at this time.  We will continue to monitor.  3.  Macrocytosis: - She does have MCV of 101.8 with normal hemoglobin.  Previous testing for vitamin 123456 and folic acid were negative.  TSH was also normal in November 2019.  4. Tobacco abuse -Patient is a current everyday smoker smoking a half a pack of cigarettes a day for the past 33 years.  Counseled the patient on the harmful effects of smoking advised her to stop.  Cessation's methods were provided to the patient. -We will recommend low-dose CT lung cancer screening when patient turns  73.       [place] [phone]

## 2019-09-14 DIAGNOSIS — K080 Exfoliation of teeth due to systemic causes: Secondary | ICD-10-CM | POA: Diagnosis not present

## 2019-11-17 ENCOUNTER — Encounter: Payer: Self-pay | Admitting: Adult Health

## 2019-11-17 ENCOUNTER — Ambulatory Visit: Payer: Federal, State, Local not specified - PPO | Admitting: Adult Health

## 2019-11-17 ENCOUNTER — Other Ambulatory Visit (HOSPITAL_COMMUNITY)
Admission: RE | Admit: 2019-11-17 | Discharge: 2019-11-17 | Disposition: A | Discharge status: Home / Self Care | Payer: Federal, State, Local not specified - PPO | Source: Ambulatory Visit | Attending: Adult Health | Admitting: Adult Health

## 2019-11-17 VITALS — BP 117/74 | HR 66 | Ht 59.75 in | Wt 85.0 lb

## 2019-11-17 DIAGNOSIS — D473 Essential (hemorrhagic) thrombocythemia: Secondary | ICD-10-CM | POA: Diagnosis not present

## 2019-11-17 DIAGNOSIS — E049 Nontoxic goiter, unspecified: Secondary | ICD-10-CM | POA: Diagnosis not present

## 2019-11-17 DIAGNOSIS — Z13220 Encounter for screening for lipoid disorders: Secondary | ICD-10-CM

## 2019-11-17 DIAGNOSIS — Z1272 Encounter for screening for malignant neoplasm of vagina: Secondary | ICD-10-CM

## 2019-11-17 DIAGNOSIS — Z1212 Encounter for screening for malignant neoplasm of rectum: Secondary | ICD-10-CM | POA: Diagnosis not present

## 2019-11-17 DIAGNOSIS — Z01419 Encounter for gynecological examination (general) (routine) without abnormal findings: Secondary | ICD-10-CM

## 2019-11-17 DIAGNOSIS — Z780 Asymptomatic menopausal state: Secondary | ICD-10-CM

## 2019-11-17 DIAGNOSIS — E559 Vitamin D deficiency, unspecified: Secondary | ICD-10-CM | POA: Diagnosis not present

## 2019-11-17 DIAGNOSIS — Z1211 Encounter for screening for malignant neoplasm of colon: Secondary | ICD-10-CM

## 2019-11-17 DIAGNOSIS — D75839 Thrombocytosis, unspecified: Secondary | ICD-10-CM

## 2019-11-17 DIAGNOSIS — F419 Anxiety disorder, unspecified: Secondary | ICD-10-CM

## 2019-11-17 LAB — HEMOCCULT GUIAC POC 1CARD (OFFICE): Fecal Occult Blood, POC: NEGATIVE

## 2019-11-17 MED ORDER — LORAZEPAM 0.5 MG PO TABS: ORAL_TABLET | ORAL | 0 refills | Status: DC

## 2019-11-17 NOTE — Progress Notes (Signed)
Patient ID: Sherri Jackson, female   DOB: 02-11-1966, 54 y.o.   MRN: [id number] History of Present Illness: Sherri Jackson is a 54 year old white female, married, PM in for a well woman gyn exam and pap. Still working at the [employer] in [place].    Current Medications, Allergies, Past Medical History, Past Surgical History, Family History and Social History were reviewed in [employer] record.     Review of Systems:  Patient denies any headaches, hearing loss, fatigue, blurred vision, shortness of breath, chest pain, abdominal pain, problems with bowel movements, urination, or intercourse. No joint pain or mood swings.She requests ativan refill for anxiety.  No vaginal bleeding, having some night sweats.  Physical Exam:BP 117/74 (BP Location: Right Arm, Patient Position: Sitting, Cuff Size: Normal)   Pulse 66   Ht 4' 11.75" (1.518 m)   Wt 85 lb (38.6 kg)   LMP 11/15/2013 Comment: bleeding 09/2016  BMI 16.74 kg/m  General:  Well developed, well nourished, no acute distress Skin:  Warm and dry Neck:  Midline trachea,  Thyroid enlarged globally, good ROM, no lymphadenopathy Lungs; Clear to auscultation bilaterally Breast:  No dominant palpable mass, retraction, or nipple discharge Cardiovascular: Regular rate and rhythm Abdomen:  Soft, non tender, no hepatosplenomegaly Pelvic:  External genitalia is normal in appearance, no lesions.  The vagina is pale with loss of moisture and rugae. Urethra has no lesions or masses. The cervix is smooth and atrophic, pap with high risk HPV 16/18 genotyping performed.  Uterus is felt to be normal size, shape, and contour.  No adnexal masses or tenderness noted.Bladder is non tender, no masses felt. Rectal: Good sphincter tone, no polyps, + hemorrhoids felt.  Hemoccult negative. Extremities/musculoskeletal:  No swelling or varicosities noted, no clubbing or cyanosis Psych:  No mood changes, alert and cooperative,seems happy Fall risk  is low PHQ 9 score is 0 Co exam with [name] NP student and she chaperoned.  Impression: 1. Encounter for gynecological examination with Papanicolaou smear of cervix Pap sent Physical in 1 year Pap in 3 if normal Check CBC,CMP,TSH and lipids,HIV  Mammogram yearly  2. Screening for colorectal cancer Colonoscopy per GI  3. Thrombocytosis ([place]) Follow up in June at the cancer center  Check CBC, B12  4. Postmenopausal No bleeding, +night sweats, declines HRT  5. Enlarged thyroid It is stable  6. Vitamin D deficiency Check vitamin D  7. Screening cholesterol level Check lipid profile  8. Anxiety Refilled ativan Meds ordered this encounter  Medications  . LORazepam (ATIVAN) 0.5 MG tablet    Sig: Take 1 every 12 hours as needed    Dispense:  30 tablet    Refill:  0    Order Specific Question:   Supervising Provider    Answer:   Tania Ade H [[id number]]

## 2019-11-18 LAB — CBC
Hematocrit: 40 % (ref 34.0–46.6)
Hemoglobin: 14.2 g/dL (ref 11.1–15.9)
MCH: 35.3 pg — ABNORMAL HIGH (ref 26.6–33.0)
MCHC: 35.5 g/dL (ref 31.5–35.7)
MCV: 100 fL — ABNORMAL HIGH (ref 79–97)
Platelets: 611 10*3/uL — ABNORMAL HIGH (ref 150–450)
RBC: 4.02 x10E6/uL (ref 3.77–5.28)
RDW: 12.1 % (ref 11.7–15.4)
WBC: 12.7 10*3/uL — ABNORMAL HIGH (ref 3.4–10.8)

## 2019-11-18 LAB — COMPREHENSIVE METABOLIC PANEL
ALT: 16 IU/L (ref 0–32)
AST: 21 IU/L (ref 0–40)
Albumin/Globulin Ratio: 2.1 (ref 1.2–2.2)
Albumin: 4.9 g/dL (ref 3.8–4.9)
Alkaline Phosphatase: 77 IU/L (ref 48–121)
BUN/Creatinine Ratio: 13 (ref 9–23)
BUN: 8 mg/dL (ref 6–24)
Bilirubin Total: 0.2 mg/dL (ref 0.0–1.2)
CO2: 22 mmol/L (ref 20–29)
Calcium: 10 mg/dL (ref 8.7–10.2)
Chloride: 99 mmol/L (ref 96–106)
Creatinine, Ser: 0.61 mg/dL (ref 0.57–1.00)
GFR calc Af Amer: 120 mL/min/{1.73_m2} (ref >59)
GFR calc non Af Amer: 104 mL/min/{1.73_m2} (ref >59)
Globulin, Total: 2.3 g/dL (ref 1.5–4.5)
Glucose: 80 mg/dL (ref 65–99)
Potassium: 5 mmol/L (ref 3.5–5.2)
Sodium: 139 mmol/L (ref 134–144)
Total Protein: 7.2 g/dL (ref 6.0–8.5)

## 2019-11-18 LAB — VITAMIN B12: Vitamin B-12: 928 pg/mL (ref 232–1245)

## 2019-11-18 LAB — LIPID PANEL
Chol/HDL Ratio: 3.5 ratio (ref 0.0–4.4)
Cholesterol, Total: 232 mg/dL — ABNORMAL HIGH (ref 100–199)
HDL: 67 mg/dL (ref >39)
LDL Chol Calc (NIH): 144 mg/dL — ABNORMAL HIGH (ref 0–99)
Triglycerides: 121 mg/dL (ref 0–149)
VLDL Cholesterol Cal: 21 mg/dL (ref 5–40)

## 2019-11-18 LAB — TSH: TSH: 0.515 u[IU]/mL (ref 0.450–4.500)

## 2019-11-18 LAB — HIV ANTIBODY (ROUTINE TESTING W REFLEX): HIV Screen 4th Generation wRfx: NONREACTIVE

## 2019-11-18 LAB — VITAMIN D 25 HYDROXY (VIT D DEFICIENCY, FRACTURES): Vit D, 25-Hydroxy: 43.6 ng/mL (ref 30.0–100.0)

## 2019-11-20 LAB — CYTOLOGY - PAP
Adequacy: ABSENT
Comment: NEGATIVE
Diagnosis: NEGATIVE
High risk HPV: NEGATIVE

## 2019-12-11 ENCOUNTER — Other Ambulatory Visit (HOSPITAL_COMMUNITY): Payer: Self-pay | Admitting: *Deleted

## 2019-12-11 DIAGNOSIS — Z0000 Encounter for general adult medical examination without abnormal findings: Secondary | ICD-10-CM

## 2019-12-11 DIAGNOSIS — D75839 Thrombocytosis, unspecified: Secondary | ICD-10-CM

## 2019-12-11 DIAGNOSIS — D7589 Other specified diseases of blood and blood-forming organs: Secondary | ICD-10-CM

## 2019-12-14 ENCOUNTER — Inpatient Hospital Stay (HOSPITAL_COMMUNITY): Payer: Federal, State, Local not specified - PPO | Attending: Hematology

## 2019-12-14 ENCOUNTER — Other Ambulatory Visit: Payer: Self-pay

## 2019-12-14 DIAGNOSIS — D473 Essential (hemorrhagic) thrombocythemia: Secondary | ICD-10-CM | POA: Insufficient documentation

## 2019-12-14 DIAGNOSIS — D7589 Other specified diseases of blood and blood-forming organs: Secondary | ICD-10-CM | POA: Insufficient documentation

## 2019-12-14 DIAGNOSIS — D75839 Thrombocytosis, unspecified: Secondary | ICD-10-CM

## 2019-12-14 DIAGNOSIS — Z0000 Encounter for general adult medical examination without abnormal findings: Secondary | ICD-10-CM

## 2019-12-14 LAB — CBC WITH DIFFERENTIAL/PLATELET
Abs Immature Granulocytes: 0.02 10*3/uL (ref 0.00–0.07)
Basophils Absolute: 0.1 10*3/uL (ref 0.0–0.1)
Basophils Relative: 1 %
Eosinophils Absolute: 2.3 10*3/uL — ABNORMAL HIGH (ref 0.0–0.5)
Eosinophils Relative: 22 %
HCT: 35.5 % — ABNORMAL LOW (ref 36.0–46.0)
Hemoglobin: 12 g/dL (ref 12.0–15.0)
Immature Granulocytes: 0 %
Lymphocytes Relative: 33 %
Lymphs Abs: 3.6 10*3/uL (ref 0.7–4.0)
MCH: 34.4 pg — ABNORMAL HIGH (ref 26.0–34.0)
MCHC: 33.8 g/dL (ref 30.0–36.0)
MCV: 101.7 fL — ABNORMAL HIGH (ref 80.0–100.0)
Monocytes Absolute: 0.5 10*3/uL (ref 0.1–1.0)
Monocytes Relative: 5 %
Neutro Abs: 4.2 10*3/uL (ref 1.7–7.7)
Neutrophils Relative %: 39 %
Platelets: 459 10*3/uL — ABNORMAL HIGH (ref 150–400)
RBC: 3.49 MIL/uL — ABNORMAL LOW (ref 3.87–5.11)
RDW: 12.5 % (ref 11.5–15.5)
WBC: 10.8 10*3/uL — ABNORMAL HIGH (ref 4.0–10.5)
nRBC: 0 % (ref 0.0–0.2)

## 2019-12-14 LAB — COMPREHENSIVE METABOLIC PANEL
ALT: 21 U/L (ref 0–44)
AST: 21 U/L (ref 15–41)
Albumin: 4.5 g/dL (ref 3.5–5.0)
Alkaline Phosphatase: 49 U/L (ref 38–126)
Anion gap: 9 (ref 5–15)
BUN: 11 mg/dL (ref 6–20)
CO2: 25 mmol/L (ref 22–32)
Calcium: 9 mg/dL (ref 8.9–10.3)
Chloride: 98 mmol/L (ref 98–111)
Creatinine, Ser: 0.55 mg/dL (ref 0.44–1.00)
GFR calc Af Amer: >60 mL/min (ref >60)
GFR calc non Af Amer: >60 mL/min (ref >60)
Glucose, Bld: 88 mg/dL (ref 70–99)
Potassium: 3.7 mmol/L (ref 3.5–5.1)
Sodium: 132 mmol/L — ABNORMAL LOW (ref 135–145)
Total Bilirubin: 0.4 mg/dL (ref 0.3–1.2)
Total Protein: 6.8 g/dL (ref 6.5–8.1)

## 2019-12-14 LAB — LACTATE DEHYDROGENASE: LDH: 144 U/L (ref 98–192)

## 2019-12-21 ENCOUNTER — Ambulatory Visit (HOSPITAL_COMMUNITY): Payer: Federal, State, Local not specified - PPO | Admitting: Oncology

## 2019-12-28 ENCOUNTER — Inpatient Hospital Stay (HOSPITAL_COMMUNITY): Payer: Federal, State, Local not specified - PPO | Admitting: Nurse Practitioner

## 2020-01-07 ENCOUNTER — Inpatient Hospital Stay (HOSPITAL_COMMUNITY): Payer: Federal, State, Local not specified - PPO | Attending: Hematology | Admitting: Nurse Practitioner

## 2020-01-07 ENCOUNTER — Other Ambulatory Visit: Payer: Self-pay

## 2020-01-07 DIAGNOSIS — E559 Vitamin D deficiency, unspecified: Secondary | ICD-10-CM | POA: Diagnosis not present

## 2020-01-07 DIAGNOSIS — Z8249 Family history of ischemic heart disease and other diseases of the circulatory system: Secondary | ICD-10-CM | POA: Insufficient documentation

## 2020-01-07 DIAGNOSIS — F17210 Nicotine dependence, cigarettes, uncomplicated: Secondary | ICD-10-CM | POA: Diagnosis not present

## 2020-01-07 DIAGNOSIS — Z801 Family history of malignant neoplasm of trachea, bronchus and lung: Secondary | ICD-10-CM | POA: Insufficient documentation

## 2020-01-07 DIAGNOSIS — Z833 Family history of diabetes mellitus: Secondary | ICD-10-CM | POA: Insufficient documentation

## 2020-01-07 DIAGNOSIS — D473 Essential (hemorrhagic) thrombocythemia: Secondary | ICD-10-CM

## 2020-01-07 DIAGNOSIS — Z79899 Other long term (current) drug therapy: Secondary | ICD-10-CM | POA: Insufficient documentation

## 2020-01-07 DIAGNOSIS — Z836 Family history of other diseases of the respiratory system: Secondary | ICD-10-CM | POA: Insufficient documentation

## 2020-01-07 DIAGNOSIS — D7589 Other specified diseases of blood and blood-forming organs: Secondary | ICD-10-CM | POA: Insufficient documentation

## 2020-01-07 DIAGNOSIS — L309 Dermatitis, unspecified: Secondary | ICD-10-CM | POA: Diagnosis not present

## 2020-01-07 DIAGNOSIS — R7989 Other specified abnormal findings of blood chemistry: Secondary | ICD-10-CM | POA: Diagnosis not present

## 2020-01-07 DIAGNOSIS — Z803 Family history of malignant neoplasm of breast: Secondary | ICD-10-CM | POA: Insufficient documentation

## 2020-01-07 DIAGNOSIS — D75839 Thrombocytosis, unspecified: Secondary | ICD-10-CM

## 2020-01-07 DIAGNOSIS — E049 Nontoxic goiter, unspecified: Secondary | ICD-10-CM | POA: Insufficient documentation

## 2020-01-07 DIAGNOSIS — D721 Eosinophilia, unspecified: Secondary | ICD-10-CM | POA: Insufficient documentation

## 2020-01-07 DIAGNOSIS — Z8349 Family history of other endocrine, nutritional and metabolic diseases: Secondary | ICD-10-CM | POA: Insufficient documentation

## 2020-01-07 NOTE — Assessment & Plan Note (Signed)
1.  Thrombocytosis: -Testing was negative for Jak 2 V6 40F mutation and BCR/ABL. -She does have a long history of thrombocytosis with platelet count mostly between 500s and 600s. -She has never had a history of thrombosis.  No connective tissue disorder.  Physical exam does not reveal any splenomegaly. -JAK2 exon 12 through 15 were negative.  PML and CAL R mutations were not assessed. -Labs done on 12/14/2019 showed platelet count 459. -We will continue to monitor no intervention necessary at this time. -Follow-up in 6 months with repeat labs.  2.  Eosinophilia: -Patient reports she has eczema with frequent flareups.  She has been using steroid creams for the past few weeks.  She goes to see her dermatologist again in September 2021. -Eosinophil count is at 2.3. -No intervention indicated at this time. -We will continue to monitor.  3.  Macrocytosis: -She has an MCV of 101.7 with normal hemoglobin. -Previous testing for vitamin B26 and folic acid were negative. -TSH was also normal in November 2019.  4.  Tobacco abuse: -Patient is a current every day smoker with half a pack of cigarettes a day for the past 33 years. -Patient was counseled on the harmful effects of smoking advised her to stop. -We will recommend a low-dose CT lung cancer screening when the patient turns 72.

## 2020-01-07 NOTE — Progress Notes (Signed)
Sherri Jackson, [place] [address]   CLINIC:  Medical Oncology/Hematology  PCP:  Patient, No Pcp Per No address on file None   REASON FOR VISIT: Follow-up for thrombocytosis   CURRENT THERAPY: Observation   INTERVAL HISTORY:  Sherri Jackson 54 y.o. female returns for routine follow-up for thrombocytosis.  Patient reports she is doing well since her last visit.  She denies any new symptoms at this time.  She denies any petechiae.  Denies any nausea, vomiting, or diarrhea. Denies any new pains. Had not noticed any recent bleeding such as epistaxis, hematuria or hematochezia. Denies recent chest pain on exertion, shortness of breath on minimal exertion, pre-syncopal episodes, or palpitations. Denies any numbness or tingling in hands or feet. Denies any recent fevers, infections, or recent hospitalizations. Patient reports appetite at 100% and energy level at 100%.  She is eating well maintain her weight at this time.     REVIEW OF SYSTEMS:  Review of Systems  All other systems reviewed and are negative.    PAST MEDICAL/SURGICAL HISTORY:  Past Medical History:  Diagnosis Date  . Elevated MCV 03/20/2016   Will check B12 and folate  . Enlarged thyroid 12/01/2013  . Peri-menopausal 12/01/2013  . Vaginal Pap smear, abnormal   . Vitamin D deficiency 03/20/2016   Past Surgical History:  Procedure Laterality Date  . ANKLE SURGERY Left   . APPENDECTOMY    . HYSTEROSCOPY W/ ENDOMETRIAL ABLATION  2004  . laser conization of cervix  2004     SOCIAL HISTORY:  Social History   Socioeconomic History  . Marital status: Married    Spouse name: Not on file  . Number of children: Not on file  . Years of education: Not on file  . Highest education level: Not on file  Occupational History  . Not on file  Tobacco Use  . Smoking status: Current Every Day Smoker    Packs/day: 0.50    Years: 33.00    Pack years: 16.50    Types: Cigarettes  . Smokeless  tobacco: Never Used  Vaping Use  . Vaping Use: Never used  Substance and Sexual Activity  . Alcohol use: Yes    Comment: occ  . Drug use: No  . Sexual activity: Yes    Birth control/protection: Post-menopausal  Other Topics Concern  . Not on file  Social History Narrative  . Not on file   Social Determinants of Health   Financial Resource Strain: Low Risk   . Difficulty of Paying Living Expenses: Not hard at all  Food Insecurity: No Food Insecurity  . Worried About Charity fundraiser in the Last Year: Never true  . Ran Out of Food in the Last Year: Never true  Transportation Needs: No Transportation Needs  . Lack of Transportation (Medical): No  . Lack of Transportation (Non-Medical): No  Physical Activity: Sufficiently Active  . Days of Exercise per Week: 7 days  . Minutes of Exercise per Session: 60 min  Stress: Stress Concern Present  . Feeling of Stress : Very much  Social Connections: Unknown  . Frequency of Communication with Friends and Family: Patient refused  . Frequency of Social Gatherings with Friends and Family: Patient refused  . Attends Religious Services: Never  . Active Member of Clubs or Organizations: Patient refused  . Attends Archivist Meetings: Patient refused  . Marital Status: Married  Human resources officer Violence: Not At Risk  . Fear of Current  or Ex-Partner: No  . Emotionally Abused: No  . Physically Abused: No  . Sexually Abused: No    FAMILY HISTORY:  Family History  Problem Relation Age of Onset  . Cancer Father        lung  . COPD Father   . Emphysema Father   . Hyperlipidemia Father   . Hypertension Father   . Thyroid disease Father   . Heart disease Paternal Grandmother   . Heart attack Paternal Grandfather   . Diabetes Paternal Uncle   . Breast cancer Cousin   . Breast cancer Cousin     CURRENT MEDICATIONS:  Outpatient Encounter Medications as of 01/07/2020  Medication Sig  . cetirizine (ZYRTEC) 10 MG tablet Take  10 mg by mouth daily.  . Multiple Vitamin (MULTIVITAMIN) tablet Take 1 tablet by mouth daily.  Marland Kitchen triamcinolone cream (KENALOG) 0.1 % Apply 1 application topically 2 (two) times daily.   Marland Kitchen ibuprofen (ADVIL,MOTRIN) 200 MG tablet Take 400 mg by mouth as needed. (Patient not taking: Reported on 01/07/2020)  . LORazepam (ATIVAN) 0.5 MG tablet Take 1 every 12 hours as needed (Patient not taking: Reported on 01/07/2020)  . [DISCONTINUED] Cholecalciferol 5000 units capsule Take 1 capsule (5,000 Units total) by mouth daily.   No facility-administered encounter medications on file as of 01/07/2020.    ALLERGIES:  No Known Allergies   PHYSICAL EXAM:  ECOG Performance status: 1  Vitals:   01/07/20 0854  BP: (!) 144/82  Pulse: 88  Resp: 18  Temp: 97.7 F (36.5 C)  SpO2: 99%   Filed Weights   01/07/20 0854  Weight: 87 lb (39.5 kg)   Physical Exam Constitutional:      Appearance: Normal appearance. She is normal weight.  Musculoskeletal:        General: Normal range of motion.  Skin:    General: Skin is warm.  Neurological:     Mental Status: She is alert and oriented to person, place, and time. Mental status is at baseline.  Psychiatric:        Mood and Affect: Mood normal.        Behavior: Behavior normal.        Thought Content: Thought content normal.        Judgment: Judgment normal.      LABORATORY DATA:  I have reviewed the labs as listed.  CBC    Component Value Date/Time   WBC 10.8 (H) 12/14/2019 1307   RBC 3.49 (L) 12/14/2019 1307   HGB 12.0 12/14/2019 1307   HGB 14.2 11/17/2019 0908   HCT 35.5 (L) 12/14/2019 1307   HCT 40.0 11/17/2019 0908   PLT 459 (H) 12/14/2019 1307   PLT 611 (H) 11/17/2019 0908   MCV 101.7 (H) 12/14/2019 1307   MCV 100 (H) 11/17/2019 0908   MCH 34.4 (H) 12/14/2019 1307   MCHC 33.8 12/14/2019 1307   RDW 12.5 12/14/2019 1307   RDW 12.1 11/17/2019 0908   LYMPHSABS 3.6 12/14/2019 1307   MONOABS 0.5 12/14/2019 1307   EOSABS 2.3 (H) 12/14/2019  1307   BASOSABS 0.1 12/14/2019 1307   CMP Latest Ref Rng & Units 12/14/2019 11/17/2019 03/19/2019  Glucose 70 - 99 mg/dL 88 80 92  BUN 6 - 20 mg/dL 11 8 7   Creatinine 0.44 - 1.00 mg/dL 0.55 0.61 0.56  Sodium 135 - 145 mmol/L 132(L) 139 138  Potassium 3.5 - 5.1 mmol/L 3.7 5.0 4.2  Chloride 98 - 111 mmol/L 98 99 103  CO2 22 -  32 mmol/L 25 22 27   Calcium 8.9 - 10.3 mg/dL 9.0 10.0 9.4  Total Protein 6.5 - 8.1 g/dL 6.8 7.2 6.9  Total Bilirubin 0.3 - 1.2 mg/dL 0.4 0.2 0.1(L)  Alkaline Phos 38 - 126 U/L 49 77 54  AST 15 - 41 U/L 21 21 19   ALT 0 - 44 U/L 21 16 16     All questions were answered to patient's stated satisfaction. Encouraged patient to call with any new concerns or questions before his next visit to the cancer center and we can certain see him sooner, if needed.     ASSESSMENT & PLAN:  Thrombocytosis ([place]) 1.  Thrombocytosis: -Testing was negative for Jak 2 V6 33F mutation and BCR/ABL. -She does have a long history of thrombocytosis with platelet count mostly between 500s and 600s. -She has never had a history of thrombosis.  No connective tissue disorder.  Physical exam does not reveal any splenomegaly. -JAK2 exon 12 through 15 were negative.  PML and CAL R mutations were not assessed. -Labs done on 12/14/2019 showed platelet count 459. -We will continue to monitor no intervention necessary at this time. -Follow-up in 6 months with repeat labs.  2.  Eosinophilia: -Patient reports she has eczema with frequent flareups.  She has been using steroid creams for the past few weeks.  She goes to see her dermatologist again in September 2021. -Eosinophil count is at 2.3. -No intervention indicated at this time. -We will continue to monitor.  3.  Macrocytosis: -She has an MCV of 101.7 with normal hemoglobin. -Previous testing for vitamin M38 and folic acid were negative. -TSH was also normal in November 2019.  4.  Tobacco abuse: -Patient is a current every day smoker with half a  pack of cigarettes a day for the past 33 years. -Patient was counseled on the harmful effects of smoking advised her to stop. -We will recommend a low-dose CT lung cancer screening when the patient turns 27.     Orders placed this encounter:  Orders Placed This Encounter  Procedures  . Lactate dehydrogenase  . CBC with Differential/Platelet  . Comprehensive metabolic panel  . Vitamin B12  . VITAMIN D 25 Hydroxy (Vit-D Deficiency, Fractures)  . Folate      Francene Finders, FNP-C [place] [phone]

## 2020-01-07 NOTE — Patient Instructions (Signed)
[facility] at [facility] Discharge Instructions  Follow up in 6 months with labs    Thank you for choosing  Cancer Center at [facility] to provide your oncology and hematology care.  To afford each patient quality time with our provider, please arrive at least 15 minutes before your scheduled appointment time.   If you have a lab appointment with the Cancer Center please come in thru the Main Entrance and check in at the main information desk.  You need to re-schedule your appointment should you arrive 10 or more minutes late.  We strive to give you quality time with our providers, and arriving late affects you and other patients whose appointments are after yours.  Also, if you no show three or more times for appointments you may be dismissed from the clinic at the providers discretion.     Again, thank you for choosing [facility].  Our hope is that these requests will decrease the amount of time that you wait before being seen by our physicians.       _____________________________________________________________  Should you have questions after your visit to [facility], please contact our office at [phone] between the hours of 8:00 a.m. and 4:30 p.m.  Voicemails left after 4:00 p.m. will not be returned until the following business day.  For prescription refill requests, have your pharmacy contact our office and allow 72 hours.    Due to Covid, you will need to wear a mask upon entering the hospital. If you do not have a mask, a mask will be given to you at the Main Entrance upon arrival. For doctor visits, patients may have 1 support person with them. For treatment visits, patients can not have anyone with them due to social distancing guidelines and our immunocompromised population.      

## 2020-02-03 DIAGNOSIS — D225 Melanocytic nevi of trunk: Secondary | ICD-10-CM | POA: Diagnosis not present

## 2020-02-03 DIAGNOSIS — Z1283 Encounter for screening for malignant neoplasm of skin: Secondary | ICD-10-CM | POA: Diagnosis not present

## 2020-02-03 DIAGNOSIS — L308 Other specified dermatitis: Secondary | ICD-10-CM | POA: Diagnosis not present

## 2020-05-13 ENCOUNTER — Other Ambulatory Visit: Payer: Self-pay | Admitting: Obstetrics & Gynecology

## 2020-05-13 DIAGNOSIS — Z1231 Encounter for screening mammogram for malignant neoplasm of breast: Secondary | ICD-10-CM

## 2020-06-23 ENCOUNTER — Ambulatory Visit
Admission: RE | Admit: 2020-06-23 | Discharge: 2020-06-23 | Disposition: A | Discharge status: Home / Self Care | Payer: Federal, State, Local not specified - PPO | Source: Ambulatory Visit | Attending: Obstetrics & Gynecology | Admitting: Obstetrics & Gynecology

## 2020-06-23 ENCOUNTER — Other Ambulatory Visit: Payer: Self-pay

## 2020-06-23 DIAGNOSIS — Z1231 Encounter for screening mammogram for malignant neoplasm of breast: Secondary | ICD-10-CM

## 2020-07-19 ENCOUNTER — Other Ambulatory Visit (HOSPITAL_COMMUNITY): Payer: Self-pay | Admitting: *Deleted

## 2020-07-19 DIAGNOSIS — D75839 Thrombocytosis, unspecified: Secondary | ICD-10-CM

## 2020-07-19 DIAGNOSIS — D7589 Other specified diseases of blood and blood-forming organs: Secondary | ICD-10-CM

## 2020-07-20 ENCOUNTER — Inpatient Hospital Stay (HOSPITAL_COMMUNITY): Payer: Federal, State, Local not specified - PPO | Attending: Hematology

## 2020-07-20 ENCOUNTER — Other Ambulatory Visit: Payer: Self-pay

## 2020-07-20 DIAGNOSIS — Z8349 Family history of other endocrine, nutritional and metabolic diseases: Secondary | ICD-10-CM | POA: Diagnosis not present

## 2020-07-20 DIAGNOSIS — Z801 Family history of malignant neoplasm of trachea, bronchus and lung: Secondary | ICD-10-CM | POA: Diagnosis not present

## 2020-07-20 DIAGNOSIS — D721 Eosinophilia, unspecified: Secondary | ICD-10-CM | POA: Insufficient documentation

## 2020-07-20 DIAGNOSIS — Z79899 Other long term (current) drug therapy: Secondary | ICD-10-CM | POA: Diagnosis not present

## 2020-07-20 DIAGNOSIS — Z833 Family history of diabetes mellitus: Secondary | ICD-10-CM | POA: Insufficient documentation

## 2020-07-20 DIAGNOSIS — D7589 Other specified diseases of blood and blood-forming organs: Secondary | ICD-10-CM

## 2020-07-20 DIAGNOSIS — Z8249 Family history of ischemic heart disease and other diseases of the circulatory system: Secondary | ICD-10-CM | POA: Diagnosis not present

## 2020-07-20 DIAGNOSIS — L299 Pruritus, unspecified: Secondary | ICD-10-CM | POA: Insufficient documentation

## 2020-07-20 DIAGNOSIS — Z803 Family history of malignant neoplasm of breast: Secondary | ICD-10-CM | POA: Diagnosis not present

## 2020-07-20 DIAGNOSIS — L309 Dermatitis, unspecified: Secondary | ICD-10-CM | POA: Insufficient documentation

## 2020-07-20 DIAGNOSIS — Z836 Family history of other diseases of the respiratory system: Secondary | ICD-10-CM | POA: Diagnosis not present

## 2020-07-20 DIAGNOSIS — Z9049 Acquired absence of other specified parts of digestive tract: Secondary | ICD-10-CM | POA: Diagnosis not present

## 2020-07-20 DIAGNOSIS — D75839 Thrombocytosis, unspecified: Secondary | ICD-10-CM

## 2020-07-20 DIAGNOSIS — F17210 Nicotine dependence, cigarettes, uncomplicated: Secondary | ICD-10-CM | POA: Insufficient documentation

## 2020-07-20 LAB — COMPREHENSIVE METABOLIC PANEL
ALT: 17 U/L (ref 0–44)
AST: 22 U/L (ref 15–41)
Albumin: 4.4 g/dL (ref 3.5–5.0)
Alkaline Phosphatase: 53 U/L (ref 38–126)
Anion gap: 9 (ref 5–15)
BUN: 8 mg/dL (ref 6–20)
CO2: 26 mmol/L (ref 22–32)
Calcium: 9.4 mg/dL (ref 8.9–10.3)
Chloride: 100 mmol/L (ref 98–111)
Creatinine, Ser: 0.58 mg/dL (ref 0.44–1.00)
GFR, Estimated: >60 mL/min (ref >60)
Glucose, Bld: 87 mg/dL (ref 70–99)
Potassium: 3.7 mmol/L (ref 3.5–5.1)
Sodium: 135 mmol/L (ref 135–145)
Total Bilirubin: 0.3 mg/dL (ref 0.3–1.2)
Total Protein: 7.2 g/dL (ref 6.5–8.1)

## 2020-07-20 LAB — VITAMIN D 25 HYDROXY (VIT D DEFICIENCY, FRACTURES): Vit D, 25-Hydroxy: 66.71 ng/mL (ref 30–100)

## 2020-07-20 LAB — CBC WITH DIFFERENTIAL/PLATELET
Abs Immature Granulocytes: 0.02 10*3/uL (ref 0.00–0.07)
Basophils Absolute: 0.1 10*3/uL (ref 0.0–0.1)
Basophils Relative: 1 %
Eosinophils Absolute: 1.8 10*3/uL — ABNORMAL HIGH (ref 0.0–0.5)
Eosinophils Relative: 17 %
HCT: 40.2 % (ref 36.0–46.0)
Hemoglobin: 13.5 g/dL (ref 12.0–15.0)
Immature Granulocytes: 0 %
Lymphocytes Relative: 36 %
Lymphs Abs: 3.9 10*3/uL (ref 0.7–4.0)
MCH: 33.9 pg (ref 26.0–34.0)
MCHC: 33.6 g/dL (ref 30.0–36.0)
MCV: 101 fL — ABNORMAL HIGH (ref 80.0–100.0)
Monocytes Absolute: 0.7 10*3/uL (ref 0.1–1.0)
Monocytes Relative: 6 %
Neutro Abs: 4.3 10*3/uL (ref 1.7–7.7)
Neutrophils Relative %: 40 %
Platelets: 559 10*3/uL — ABNORMAL HIGH (ref 150–400)
RBC: 3.98 MIL/uL (ref 3.87–5.11)
RDW: 12.3 % (ref 11.5–15.5)
WBC: 10.8 10*3/uL — ABNORMAL HIGH (ref 4.0–10.5)
nRBC: 0 % (ref 0.0–0.2)

## 2020-07-20 LAB — VITAMIN B12: Vitamin B-12: 543 pg/mL (ref 180–914)

## 2020-07-20 LAB — FOLATE: Folate: 10.4 ng/mL (ref >5.9)

## 2020-07-20 LAB — LACTATE DEHYDROGENASE: LDH: 143 U/L (ref 98–192)

## 2020-07-27 ENCOUNTER — Other Ambulatory Visit: Payer: Self-pay

## 2020-07-27 ENCOUNTER — Inpatient Hospital Stay (HOSPITAL_COMMUNITY): Payer: Federal, State, Local not specified - PPO | Admitting: Hematology

## 2020-07-27 VITALS — BP 121/71 | HR 95 | Temp 97.3°F | Resp 16 | Wt 90.2 lb

## 2020-07-27 DIAGNOSIS — D75839 Thrombocytosis, unspecified: Secondary | ICD-10-CM

## 2020-07-27 DIAGNOSIS — L309 Dermatitis, unspecified: Secondary | ICD-10-CM | POA: Diagnosis not present

## 2020-07-27 DIAGNOSIS — F17210 Nicotine dependence, cigarettes, uncomplicated: Secondary | ICD-10-CM | POA: Diagnosis not present

## 2020-07-27 DIAGNOSIS — Z79899 Other long term (current) drug therapy: Secondary | ICD-10-CM | POA: Diagnosis not present

## 2020-07-27 DIAGNOSIS — D721 Eosinophilia, unspecified: Secondary | ICD-10-CM | POA: Diagnosis not present

## 2020-07-27 DIAGNOSIS — Z801 Family history of malignant neoplasm of trachea, bronchus and lung: Secondary | ICD-10-CM | POA: Diagnosis not present

## 2020-07-27 DIAGNOSIS — Z833 Family history of diabetes mellitus: Secondary | ICD-10-CM | POA: Diagnosis not present

## 2020-07-27 DIAGNOSIS — Z803 Family history of malignant neoplasm of breast: Secondary | ICD-10-CM | POA: Diagnosis not present

## 2020-07-27 DIAGNOSIS — Z9049 Acquired absence of other specified parts of digestive tract: Secondary | ICD-10-CM | POA: Diagnosis not present

## 2020-07-27 DIAGNOSIS — Z836 Family history of other diseases of the respiratory system: Secondary | ICD-10-CM | POA: Diagnosis not present

## 2020-07-27 DIAGNOSIS — D7589 Other specified diseases of blood and blood-forming organs: Secondary | ICD-10-CM | POA: Diagnosis not present

## 2020-07-27 DIAGNOSIS — Z8349 Family history of other endocrine, nutritional and metabolic diseases: Secondary | ICD-10-CM | POA: Diagnosis not present

## 2020-07-27 DIAGNOSIS — L299 Pruritus, unspecified: Secondary | ICD-10-CM | POA: Diagnosis not present

## 2020-07-27 DIAGNOSIS — Z8249 Family history of ischemic heart disease and other diseases of the circulatory system: Secondary | ICD-10-CM | POA: Diagnosis not present

## 2020-07-27 NOTE — Patient Instructions (Signed)
[facility] at [facility] Discharge Instructions  You were seen today by Dr. Katragadda. He went over your recent results and scans. Dr. Katragadda will see you back in 6 months for labs and follow up.   Thank you for choosing  Cancer Center at [facility] to provide your oncology and hematology care.  To afford each patient quality time with our provider, please arrive at least 15 minutes before your scheduled appointment time.   If you have a lab appointment with the Cancer Center please come in thru the Main Entrance and check in at the main information desk  You need to re-schedule your appointment should you arrive 10 or more minutes late.  We strive to give you quality time with our providers, and arriving late affects you and other patients whose appointments are after yours.  Also, if you no show three or more times for appointments you may be dismissed from the clinic at the providers discretion.     Again, thank you for choosing [facility].  Our hope is that these requests will decrease the amount of time that you wait before being seen by our physicians.       _____________________________________________________________  Should you have questions after your visit to [facility], please contact our office at [phone] between the hours of 8:00 a.m. and 4:30 p.m.  Voicemails left after 4:00 p.m. will not be returned until the following business day.  For prescription refill requests, have your pharmacy contact our office and allow 72 hours.    Cancer Center Support Programs:   > Cancer Support Group  2nd Tuesday of the month 1pm-2pm, Journey Room   

## 2020-07-27 NOTE — Progress Notes (Signed)
Sherri Jackson, [place] [address]   CLINIC:  Medical Oncology/Hematology  PCP:  Patient, No Pcp Per None  None  REASON FOR VISIT:  Follow-up for thrombocytosis and eosinophilia  PRIOR THERAPY: None  CURRENT THERAPY: Observation  INTERVAL HISTORY:  Sherri Jackson, a 55 y.o. female, returns for routine follow-up for her thrombocytosis and eosinophilia. Sherri Jackson was last seen by Francene Finders, NP, on 01/07/2020.  Today Sherri Jackson reports feeling well. Sherri Jackson denies having more DVT's or recent infections. Sherri Jackson stopped using the Kenalog cream on 03/2020 since it was maker her skin even more dry; Sherri Jackson started using coconut oil and Cerave. Sherri Jackson continues having itching when Sherri Jackson is in bed at night, but denies aquagenic pruritis, Raynaud phenomenon, skin rashes or adenopathy. Sherri Jackson denies having any cough.  Sherri Jackson denies family history of thrombocytosis or erythrocytosis. Sherri Jackson sees Anderson Malta, her [place], once a year in May.   REVIEW OF SYSTEMS:  Review of Systems  Constitutional: Negative for appetite change and fatigue.  Respiratory: Negative for cough.   Cardiovascular: Negative for leg swelling.  Skin: Positive for itching (in bed). Negative for rash.  Hematological: Negative for adenopathy.  All other systems reviewed and are negative.   PAST MEDICAL/SURGICAL HISTORY:  Past Medical History:  Diagnosis Date  . Elevated MCV 03/20/2016   Will check B12 and folate  . Enlarged thyroid 12/01/2013  . Peri-menopausal 12/01/2013  . Vaginal Pap smear, abnormal   . Vitamin D deficiency 03/20/2016   Past Surgical History:  Procedure Laterality Date  . ANKLE SURGERY Left   . APPENDECTOMY    . HYSTEROSCOPY W/ ENDOMETRIAL ABLATION  2004  . laser conization of cervix  2004    SOCIAL HISTORY:  Social History   Socioeconomic History  . Marital status: Married    Spouse name: Not on file  . Number of children: Not on file  . Years of education: Not on file  . Highest  education level: Not on file  Occupational History  . Not on file  Tobacco Use  . Smoking status: Current Every Day Smoker    Packs/day: 0.50    Years: 33.00    Pack years: 16.50    Types: Cigarettes  . Smokeless tobacco: Never Used  Vaping Use  . Vaping Use: Never used  Substance and Sexual Activity  . Alcohol use: Yes    Comment: occ  . Drug use: No  . Sexual activity: Yes    Birth control/protection: Post-menopausal  Other Topics Concern  . Not on file  Social History Narrative  . Not on file   Social Determinants of Health   Financial Resource Strain: Low Risk   . Difficulty of Paying Living Expenses: Not hard at all  Food Insecurity: No Food Insecurity  . Worried About Charity fundraiser in the Last Year: Never true  . Ran Out of Food in the Last Year: Never true  Transportation Needs: No Transportation Needs  . Lack of Transportation (Medical): No  . Lack of Transportation (Non-Medical): No  Physical Activity: Sufficiently Active  . Days of Exercise per Week: 7 days  . Minutes of Exercise per Session: 60 min  Stress: Stress Concern Present  . Feeling of Stress : Very much  Social Connections: Unknown  . Frequency of Communication with Friends and Family: Patient refused  . Frequency of Social Gatherings with Friends and Family: Patient refused  . Attends Religious Services: Never  . Active Member of Clubs  or Organizations: Patient refused  . Attends Archivist Meetings: Patient refused  . Marital Status: Married  Human resources officer Violence: Not At Risk  . Fear of Current or Ex-Partner: No  . Emotionally Abused: No  . Physically Abused: No  . Sexually Abused: No    FAMILY HISTORY:  Family History  Problem Relation Age of Onset  . Cancer Father        lung  . COPD Father   . Emphysema Father   . Hyperlipidemia Father   . Hypertension Father   . Thyroid disease Father   . Heart disease Paternal Grandmother   . Heart attack Paternal  Grandfather   . Diabetes Paternal Uncle   . Breast cancer Cousin   . Breast cancer Cousin     CURRENT MEDICATIONS:  Current Outpatient Medications  Medication Sig Dispense Refill  . Biotin 5 MG TABS     . cetirizine (ZYRTEC) 10 MG tablet Take 10 mg by mouth daily.    Marland Kitchen ibuprofen (ADVIL,MOTRIN) 200 MG tablet Take 400 mg by mouth as needed.    Marland Kitchen LORazepam (ATIVAN) 0.5 MG tablet Take 1 every 12 hours as needed 30 tablet 0  . Multiple Vitamin (MULTIVITAMIN) tablet Take 1 tablet by mouth daily.    Marland Kitchen triamcinolone cream (KENALOG) 0.1 % Apply 1 application topically 2 (two) times daily.     . vitamin B-12 (CYANOCOBALAMIN) 100 MCG tablet     . VITAMIN D, ERGOCALCIFEROL, PO      No current facility-administered medications for this visit.    ALLERGIES:  No Known Allergies  PHYSICAL EXAM:  Performance status (ECOG): 1 - Symptomatic but completely ambulatory  Vitals:   07/27/20 1557  BP: 121/71  Pulse: 95  Resp: 16  Temp: (!) 97.3 F (36.3 C)  SpO2: 100%   Wt Readings from Last 3 Encounters:  07/27/20 90 lb 3.2 oz (40.9 kg)  01/07/20 87 lb (39.5 kg)  11/17/19 85 lb (38.6 kg)   Physical Exam Vitals reviewed.  Constitutional:      Appearance: Normal appearance.  Cardiovascular:     Rate and Rhythm: Normal rate and regular rhythm.     Pulses: Normal pulses.     Heart sounds: Normal heart sounds.  Pulmonary:     Effort: Pulmonary effort is normal.     Breath sounds: Normal breath sounds.  Chest:  Breasts:     Right: No axillary adenopathy or supraclavicular adenopathy.     Left: No axillary adenopathy or supraclavicular adenopathy.    Abdominal:     Palpations: Abdomen is soft. There is no hepatomegaly, splenomegaly or mass.     Tenderness: There is no abdominal tenderness.     Hernia: No hernia is present.  Musculoskeletal:     Right lower leg: No edema.     Left lower leg: No edema.  Lymphadenopathy:     Cervical: No cervical adenopathy.     Upper Body:      Right upper body: No supraclavicular, axillary or pectoral adenopathy.     Left upper body: No supraclavicular, axillary or pectoral adenopathy.  Neurological:     General: No focal deficit present.     Mental Status: Sherri Jackson is alert and oriented to person, place, and time.  Psychiatric:        Mood and Affect: Mood normal.        Behavior: Behavior normal.     LABORATORY DATA:  I have reviewed the labs as listed.  CBC Latest  Ref Rng & Units 07/20/2020 12/14/2019 11/17/2019  WBC 4.0 - 10.5 K/uL 10.8(H) 10.8(H) 12.7(H)  Hemoglobin 12.0 - 15.0 g/dL 13.5 12.0 14.2  Hematocrit 36.0 - 46.0 % 40.2 35.5(L) 40.0  Platelets 150 - 400 K/uL 559(H) 459(H) 611(H)   CMP Latest Ref Rng & Units 07/20/2020 12/14/2019 11/17/2019  Glucose 70 - 99 mg/dL 87 88 80  BUN 6 - 20 mg/dL 8 11 8   Creatinine 0.44 - 1.00 mg/dL 0.58 0.55 0.61  Sodium 135 - 145 mmol/L 135 132(L) 139  Potassium 3.5 - 5.1 mmol/L 3.7 3.7 5.0  Chloride 98 - 111 mmol/L 100 98 99  CO2 22 - 32 mmol/L 26 25 22   Calcium 8.9 - 10.3 mg/dL 9.4 9.0 10.0  Total Protein 6.5 - 8.1 g/dL 7.2 6.8 7.2  Total Bilirubin 0.3 - 1.2 mg/dL 0.3 0.4 0.2  Alkaline Phos 38 - 126 U/L 53 49 77  AST 15 - 41 U/L 22 21 21   ALT 0 - 44 U/L 17 21 16       Component Value Date/Time   RBC 3.98 07/20/2020 1528   MCV 101.0 (H) 07/20/2020 1528   MCV 100 (H) 11/17/2019 0908   MCH 33.9 07/20/2020 1528   MCHC 33.6 07/20/2020 1528   RDW 12.3 07/20/2020 1528   RDW 12.1 11/17/2019 0908   LYMPHSABS 3.9 07/20/2020 1528   MONOABS 0.7 07/20/2020 1528   EOSABS 1.8 (H) 07/20/2020 1528   BASOSABS 0.1 07/20/2020 1528   Lab Results  Component Value Date   LDH 143 07/20/2020   LDH 144 12/14/2019   LDH 162 06/01/2019   Lab Results  Component Value Date   VD25OH 66.71 07/20/2020   VD25OH 43.6 11/17/2019   VD25OH 94.2 05/21/2018    DIAGNOSTIC IMAGING:  I have independently reviewed the scans and discussed with the patient. No results found.   ASSESSMENT:  1.   Thrombocytosis: -Testing was negative for Jak 2 V6 65F mutation and BCR/ABL. -Sherri Jackson does have a long history of thrombocytosis with platelet count mostly between 500s and 600s. -Sherri Jackson has never had a history of thrombosis.  No connective tissue disorder.  Physical exam does not reveal any splenomegaly. -JAK2 exon 12 through 15 were negative.  PML and CAL R mutations were not assessed.   2.  Eosinophilia: -Patient reports Sherri Jackson has eczema with frequent flareups.     3.  Macrocytosis: -Sherri Jackson has an MCV of 101.7 with normal hemoglobin. -Previous testing for vitamin C37 and folic acid were negative. -TSH was also normal in November 2019.  4.  Tobacco abuse: -Patient is a current every day smoker with half a pack of cigarettes a day for the past 33 years.   PLAN:  1.    JAK2 V665F negative thrombocytosis: -Sherri Jackson did not have any thrombosis history or recent infections in the last 6 months.  No B symptoms. -Reviewed labs from 07/20/2020.  Platelet count is 559.  Her platelet count has been under 600. -Physical exam did not reveal any palpable splenomegaly or lymphadenopathy. -We will recommend follow-up in 6 months.  We will plan to check for CALR and MPL mutations.  2.  Eosinophilia: -Sherri Jackson has elevated absolute eosinophil count for the past few years. -Likely related to her eczema.  No evidence of hypereosinophilic syndrome.   Orders placed this encounter:  No orders of the defined types were placed in this encounter.    Derek Jack, MD [place] [phone]   I, Milinda Antis, am acting as a  scribe for Dr. Sanda Linger.  I, Derek Jack MD, have reviewed the above documentation for accuracy and completeness, and I agree with the above.

## 2020-11-01 DIAGNOSIS — L03029 Acute lymphangitis of unspecified finger: Secondary | ICD-10-CM | POA: Diagnosis not present

## 2020-12-19 ENCOUNTER — Encounter: Payer: Self-pay | Admitting: Adult Health

## 2020-12-19 ENCOUNTER — Other Ambulatory Visit: Payer: Self-pay | Admitting: Adult Health

## 2020-12-19 ENCOUNTER — Ambulatory Visit (INDEPENDENT_AMBULATORY_CARE_PROVIDER_SITE_OTHER): Payer: Federal, State, Local not specified - PPO | Admitting: Adult Health

## 2020-12-19 ENCOUNTER — Other Ambulatory Visit: Payer: Self-pay

## 2020-12-19 VITALS — BP 138/69 | HR 65 | Ht 59.0 in | Wt 89.0 lb

## 2020-12-19 DIAGNOSIS — E782 Mixed hyperlipidemia: Secondary | ICD-10-CM | POA: Insufficient documentation

## 2020-12-19 DIAGNOSIS — Z1211 Encounter for screening for malignant neoplasm of colon: Secondary | ICD-10-CM | POA: Diagnosis not present

## 2020-12-19 DIAGNOSIS — Z780 Asymptomatic menopausal state: Secondary | ICD-10-CM

## 2020-12-19 DIAGNOSIS — Z01419 Encounter for gynecological examination (general) (routine) without abnormal findings: Secondary | ICD-10-CM | POA: Diagnosis not present

## 2020-12-19 LAB — HEMOCCULT GUIAC POC 1CARD (OFFICE): Fecal Occult Blood, POC: NEGATIVE

## 2020-12-19 NOTE — Progress Notes (Signed)
Patient ID: Sherri Jackson, female   DOB: [birth year], 55 y.o.   MRN: [id number] History of Present Illness:  Sherri Jackson is a 55 year old white female, married, PM in for a well woman gyn exam. Lab Results  Component Value Date   DIAGPAP  11/17/2019    - Negative for intraepithelial lesion or malignancy (NILM)   HPV NOT DETECTED 04/08/2017   [place] Negative 11/17/2019     Current Medications, Allergies, Past Medical History, Past Surgical History, Family History and Social History were reviewed in [employer] record.     Review of Systems: Patient denies any headaches, hearing loss, fatigue, blurred vision, shortness of breath, chest pain, abdominal pain, problems with bowel movements, urination, or intercourse. No joint pain or mood swings.     Physical Exam:BP 138/69 (BP Location: Right Arm, Patient Position: Sitting, Cuff Size: Normal)   Pulse 65   Ht 4\' 11"  (1.499 m)   Wt 89 lb (40.4 kg)   LMP 11/15/2013 Comment: bleeding 09/2016  BMI 17.98 kg/m   General:  Well developed, well nourished, no acute distress Skin:  Warm and dry Neck:  Midline trachea, normal thyroid, good ROM, no lymphadenopathy Lungs; Clear to auscultation bilaterally Breast:  No dominant palpable mass, retraction, or nipple discharge Cardiovascular: Regular rate and rhythm Abdomen:  Soft, non tender, no hepatosplenomegaly Pelvic:  External genitalia is normal in appearance, no lesions.  The vagina is normal in appearance. Urethra has no lesions or masses. The cervix is smooth.  Uterus is felt to be normal size, shape, and contour.  No adnexal masses or tenderness noted.Bladder is non tender, no masses felt. Rectal: Good sphincter tone, no polyps, or hemorrhoids felt.  Hemoccult negative. Extremities/musculoskeletal:  No swelling or varicosities noted, no clubbing or cyanosis Psych:  No mood changes, alert and cooperative,seems happy AA is 3 Fall risk is low Depression screen [facility] 2/9  12/19/2020 11/17/2019 05/21/2018  Decreased Interest 0 0 0  Down, Depressed, Hopeless 0 0 0  PHQ - 2 Score 0 0 0  Altered sleeping 0 0 -  Tired, decreased energy 0 0 -  Change in appetite 0 0 -  Feeling bad or failure about yourself  0 0 -  Trouble concentrating 0 0 -  Moving slowly or fidgety/restless 0 0 -  Suicidal thoughts 0 0 -  PHQ-9 Score 0 0 -    GAD 7 : Generalized Anxiety Score 12/19/2020 11/17/2019  Nervous, Anxious, on Edge 0 1  Control/stop worrying 0 1  Worry too much - different things 0 1  Trouble relaxing 0 1  Restless 0 1  Easily annoyed or irritable 0 1  Afraid - awful might happen 0 1  Total GAD 7 Score 0 7      Upstream - 12/19/20 1449       Pregnancy Intention Screening   Does the patient want to become pregnant in the next year? No    Does the patient's partner want to become pregnant in the next year? No    Would the patient like to discuss contraceptive options today? No      Contraception Wrap Up   Current Method No Method - Other Reason   Post-menopausal   End Method No Method - Other Reason    Contraception Counseling Provided No            Examination chaperoned by Glenard Haring RN  Impression and Plan: 1. Encounter for well woman exam with routine gynecological exam Physical in  1 year  -pap in 2024 - CBC - Comprehensive metabolic panel - TSH - Lipid panel Mammogram yearly Will check labs   2. Encounter for screening fecal occult blood testing  - POCT occult blood stool  3. Postmenopausal   4. Elevated cholesterol with elevated triglycerides Check lipid panel - Lipid panel

## 2020-12-20 LAB — CBC
Hematocrit: 41.2 % (ref 34.0–46.6)
Hemoglobin: 13.5 g/dL (ref 11.1–15.9)
MCH: 33 pg (ref 26.6–33.0)
MCHC: 32.8 g/dL (ref 31.5–35.7)
MCV: 101 fL — ABNORMAL HIGH (ref 79–97)
Platelets: 583 10*3/uL — ABNORMAL HIGH (ref 150–450)
RBC: 4.09 x10E6/uL (ref 3.77–5.28)
RDW: 12 % (ref 11.7–15.4)
WBC: 12.1 10*3/uL — ABNORMAL HIGH (ref 3.4–10.8)

## 2020-12-20 LAB — COMPREHENSIVE METABOLIC PANEL
ALT: 11 IU/L (ref 0–32)
AST: 18 IU/L (ref 0–40)
Albumin/Globulin Ratio: 2.2 (ref 1.2–2.2)
Albumin: 4.7 g/dL (ref 3.8–4.9)
Alkaline Phosphatase: 65 IU/L (ref 44–121)
BUN/Creatinine Ratio: 13 (ref 9–23)
BUN: 8 mg/dL (ref 6–24)
Bilirubin Total: 0.3 mg/dL (ref 0.0–1.2)
CO2: 22 mmol/L (ref 20–29)
Calcium: 9.9 mg/dL (ref 8.7–10.2)
Chloride: 96 mmol/L (ref 96–106)
Creatinine, Ser: 0.6 mg/dL (ref 0.57–1.00)
Globulin, Total: 2.1 g/dL (ref 1.5–4.5)
Glucose: 78 mg/dL (ref 65–99)
Potassium: 4.4 mmol/L (ref 3.5–5.2)
Sodium: 133 mmol/L — ABNORMAL LOW (ref 134–144)
Total Protein: 6.8 g/dL (ref 6.0–8.5)
eGFR: 107 mL/min/{1.73_m2} (ref >59)

## 2020-12-20 LAB — LIPID PANEL
Chol/HDL Ratio: 3.2 ratio (ref 0.0–4.4)
Cholesterol, Total: 212 mg/dL — ABNORMAL HIGH (ref 100–199)
HDL: 66 mg/dL (ref >39)
LDL Chol Calc (NIH): 129 mg/dL — ABNORMAL HIGH (ref 0–99)
Triglycerides: 94 mg/dL (ref 0–149)
VLDL Cholesterol Cal: 17 mg/dL (ref 5–40)

## 2020-12-20 LAB — TSH: TSH: 1.22 u[IU]/mL (ref 0.450–4.500)

## 2021-01-05 ENCOUNTER — Other Ambulatory Visit: Payer: Self-pay | Admitting: Adult Health

## 2021-01-05 MED ORDER — LORAZEPAM 0.5 MG PO TABS: ORAL_TABLET | ORAL | 0 refills | Status: DC

## 2021-01-05 NOTE — Progress Notes (Signed)
Will refill ativan

## 2021-02-01 ENCOUNTER — Other Ambulatory Visit (HOSPITAL_COMMUNITY): Payer: Federal, State, Local not specified - PPO

## 2021-02-02 ENCOUNTER — Inpatient Hospital Stay (HOSPITAL_COMMUNITY): Payer: Federal, State, Local not specified - PPO

## 2021-02-07 DIAGNOSIS — Z1283 Encounter for screening for malignant neoplasm of skin: Secondary | ICD-10-CM | POA: Diagnosis not present

## 2021-02-07 DIAGNOSIS — L821 Other seborrheic keratosis: Secondary | ICD-10-CM | POA: Diagnosis not present

## 2021-02-08 ENCOUNTER — Ambulatory Visit (HOSPITAL_COMMUNITY): Payer: Federal, State, Local not specified - PPO | Admitting: Hematology

## 2021-03-08 ENCOUNTER — Other Ambulatory Visit: Payer: Self-pay

## 2021-03-08 ENCOUNTER — Inpatient Hospital Stay (HOSPITAL_COMMUNITY): Payer: Federal, State, Local not specified - PPO | Attending: Hematology

## 2021-03-08 DIAGNOSIS — Z79899 Other long term (current) drug therapy: Secondary | ICD-10-CM | POA: Diagnosis not present

## 2021-03-08 DIAGNOSIS — Z836 Family history of other diseases of the respiratory system: Secondary | ICD-10-CM | POA: Insufficient documentation

## 2021-03-08 DIAGNOSIS — Z801 Family history of malignant neoplasm of trachea, bronchus and lung: Secondary | ICD-10-CM | POA: Insufficient documentation

## 2021-03-08 DIAGNOSIS — D75839 Thrombocytosis, unspecified: Secondary | ICD-10-CM | POA: Diagnosis not present

## 2021-03-08 DIAGNOSIS — D721 Eosinophilia, unspecified: Secondary | ICD-10-CM | POA: Insufficient documentation

## 2021-03-08 DIAGNOSIS — Z9049 Acquired absence of other specified parts of digestive tract: Secondary | ICD-10-CM | POA: Insufficient documentation

## 2021-03-08 DIAGNOSIS — F17210 Nicotine dependence, cigarettes, uncomplicated: Secondary | ICD-10-CM | POA: Diagnosis not present

## 2021-03-08 DIAGNOSIS — D7589 Other specified diseases of blood and blood-forming organs: Secondary | ICD-10-CM | POA: Diagnosis not present

## 2021-03-08 DIAGNOSIS — Z8249 Family history of ischemic heart disease and other diseases of the circulatory system: Secondary | ICD-10-CM | POA: Insufficient documentation

## 2021-03-08 DIAGNOSIS — L309 Dermatitis, unspecified: Secondary | ICD-10-CM | POA: Diagnosis not present

## 2021-03-08 DIAGNOSIS — Z833 Family history of diabetes mellitus: Secondary | ICD-10-CM | POA: Diagnosis not present

## 2021-03-08 DIAGNOSIS — Z803 Family history of malignant neoplasm of breast: Secondary | ICD-10-CM | POA: Insufficient documentation

## 2021-03-08 DIAGNOSIS — Z8349 Family history of other endocrine, nutritional and metabolic diseases: Secondary | ICD-10-CM | POA: Insufficient documentation

## 2021-03-08 LAB — CBC WITH DIFFERENTIAL/PLATELET
Abs Immature Granulocytes: 0.02 10*3/uL (ref 0.00–0.07)
Basophils Absolute: 0.1 10*3/uL (ref 0.0–0.1)
Basophils Relative: 1 %
Eosinophils Absolute: 1.7 10*3/uL — ABNORMAL HIGH (ref 0.0–0.5)
Eosinophils Relative: 17 %
HCT: 38.5 % (ref 36.0–46.0)
Hemoglobin: 13.2 g/dL (ref 12.0–15.0)
Immature Granulocytes: 0 %
Lymphocytes Relative: 37 %
Lymphs Abs: 3.6 10*3/uL (ref 0.7–4.0)
MCH: 34.2 pg — ABNORMAL HIGH (ref 26.0–34.0)
MCHC: 34.3 g/dL (ref 30.0–36.0)
MCV: 99.7 fL (ref 80.0–100.0)
Monocytes Absolute: 0.6 10*3/uL (ref 0.1–1.0)
Monocytes Relative: 7 %
Neutro Abs: 3.7 10*3/uL (ref 1.7–7.7)
Neutrophils Relative %: 38 %
Platelets: 570 10*3/uL — ABNORMAL HIGH (ref 150–400)
RBC: 3.86 MIL/uL — ABNORMAL LOW (ref 3.87–5.11)
RDW: 12.4 % (ref 11.5–15.5)
WBC: 9.7 10*3/uL (ref 4.0–10.5)
nRBC: 0 % (ref 0.0–0.2)

## 2021-03-08 LAB — COMPREHENSIVE METABOLIC PANEL
ALT: 16 U/L (ref 0–44)
AST: 19 U/L (ref 15–41)
Albumin: 4.4 g/dL (ref 3.5–5.0)
Alkaline Phosphatase: 64 U/L (ref 38–126)
Anion gap: 9 (ref 5–15)
BUN: 7 mg/dL (ref 6–20)
CO2: 24 mmol/L (ref 22–32)
Calcium: 9.1 mg/dL (ref 8.9–10.3)
Chloride: 101 mmol/L (ref 98–111)
Creatinine, Ser: 0.55 mg/dL (ref 0.44–1.00)
GFR, Estimated: >60 mL/min (ref >60)
Glucose, Bld: 95 mg/dL (ref 70–99)
Potassium: 3.7 mmol/L (ref 3.5–5.1)
Sodium: 134 mmol/L — ABNORMAL LOW (ref 135–145)
Total Bilirubin: 0.2 mg/dL — ABNORMAL LOW (ref 0.3–1.2)
Total Protein: 7.1 g/dL (ref 6.5–8.1)

## 2021-03-09 ENCOUNTER — Other Ambulatory Visit (HOSPITAL_COMMUNITY): Payer: Federal, State, Local not specified - PPO

## 2021-03-13 NOTE — Progress Notes (Signed)
Sherri Jackson, [place] [id number]   CLINIC:  Medical Oncology/Hematology  PCP:  Patient, No Pcp Per (Inactive) None  None  REASON FOR VISIT:  Follow-up for  thrombocytosis and eosinophilia  PRIOR THERAPY: none  CURRENT THERAPY: surveillance  INTERVAL HISTORY:  [name], a 55 y.o. female, returns for routine follow-up for her  thrombocytosis and eosinophilia. Sherri Jackson was last seen on 07/27/2020.  Today she reports feeling good. She denies recent infections and tingling/numbness. She reports stable eczema on her back which is irritated by heat.  REVIEW OF SYSTEMS:  Review of Systems  Constitutional:  Negative for appetite change and fatigue.  Skin:  Positive for rash (eczema).  Neurological:  Negative for numbness.  All other systems reviewed and are negative.  PAST MEDICAL/SURGICAL HISTORY:  Past Medical History:  Diagnosis Date   Elevated MCV 03/20/2016   Will check B12 and folate   Enlarged thyroid 12/01/2013   Peri-menopausal 12/01/2013   Vaginal Pap smear, abnormal    Vitamin D deficiency 03/20/2016   Past Surgical History:  Procedure Laterality Date   ANKLE SURGERY Left    APPENDECTOMY     HYSTEROSCOPY W/ ENDOMETRIAL ABLATION  2004   laser conization of cervix  2004    SOCIAL HISTORY:  Social History   Socioeconomic History   Marital status: Married    Spouse name: Not on file   Number of children: Not on file   Years of education: Not on file   Highest education level: Not on file  Occupational History   Not on file  Tobacco Use   Smoking status: Every Day    Packs/day: 0.50    Years: 33.00    Pack years: 16.50    Types: Cigarettes   Smokeless tobacco: Never  Vaping Use   Vaping Use: Never used  Substance and Sexual Activity   Alcohol use: Yes    Comment: occ   Drug use: No   Sexual activity: Yes    Birth control/protection: Post-menopausal  Other Topics Concern   Not on file  Social History  Narrative   Not on file   Social Determinants of Health   Financial Resource Strain: Low Risk    Difficulty of Paying Living Expenses: Not hard at all  Food Insecurity: No Food Insecurity   Worried About Charity fundraiser in the Last Year: Never true   Arboriculturist in the Last Year: Never true  Transportation Needs: No Transportation Needs   Lack of Transportation (Medical): No   Lack of Transportation (Non-Medical): No  Physical Activity: Sufficiently Active   Days of Exercise per Week: 5 days   Minutes of Exercise per Session: 60 min  Stress: Stress Concern Present   Feeling of Stress : To some extent  Social Connections: Socially Isolated   Frequency of Communication with Friends and Family: Once a week   Frequency of Social Gatherings with Friends and Family: Once a week   Attends Religious Services: Never   Marine scientist or Organizations: No   Attends Music therapist: Patient refused   Marital Status: Married  Human resources officer Violence: Not At Risk   Fear of Current or Ex-Partner: No   Emotionally Abused: No   Physically Abused: No   Sexually Abused: No    FAMILY HISTORY:  Family History  Problem Relation Age of Onset   Cancer Father        lung  COPD Father    Emphysema Father    Hyperlipidemia Father    Hypertension Father    Thyroid disease Father    Heart disease Paternal Grandmother    Heart attack Paternal Grandfather    Diabetes Paternal Uncle    Breast cancer Cousin    Breast cancer Cousin     CURRENT MEDICATIONS:  Current Outpatient Medications  Medication Sig Dispense Refill   Biotin 5 MG TABS      cetirizine (ZYRTEC) 10 MG tablet Take 10 mg by mouth daily.     Multiple Vitamin (MULTIVITAMIN) tablet Take 1 tablet by mouth daily.     triamcinolone cream (KENALOG) 0.1 % Apply 1 application topically 2 (two) times daily.     vitamin B-12 (CYANOCOBALAMIN) 100 MCG tablet      VITAMIN D, ERGOCALCIFEROL, PO      ibuprofen  (ADVIL,MOTRIN) 200 MG tablet Take 400 mg by mouth as needed. (Patient not taking: Reported on 03/14/2021)     LORazepam (ATIVAN) 0.5 MG tablet Take 1 every 12 hours as needed (Patient not taking: Reported on 03/14/2021) 30 tablet 0   No current facility-administered medications for this visit.    ALLERGIES:  No Known Allergies  PHYSICAL EXAM:  Performance status (ECOG): 1 - Symptomatic but completely ambulatory  Vitals:   03/14/21 1400  BP: 127/64  Pulse: 95  Resp: 18  Temp: (!) 96.8 F (36 C)  SpO2: 97%   Wt Readings from Last 3 Encounters:  03/14/21 88 lb 12.8 oz (40.3 kg)  12/19/20 89 lb (40.4 kg)  07/27/20 90 lb 3.2 oz (40.9 kg)   Physical Exam Vitals reviewed.  Constitutional:      Appearance: Normal appearance.  Cardiovascular:     Rate and Rhythm: Normal rate and regular rhythm.     Pulses: Normal pulses.     Heart sounds: Normal heart sounds.  Pulmonary:     Effort: Pulmonary effort is normal.     Breath sounds: Normal breath sounds.  Abdominal:     Palpations: Abdomen is soft. There is no hepatomegaly, splenomegaly or mass.     Tenderness: There is no abdominal tenderness.  Musculoskeletal:     Right lower leg: No edema.     Left lower leg: No edema.  Lymphadenopathy:     Upper Body:     Right upper body: No supraclavicular adenopathy.     Left upper body: No supraclavicular adenopathy.     Lower Body: No right inguinal adenopathy. No left inguinal adenopathy.  Neurological:     General: No focal deficit present.     Mental Status: She is alert and oriented to person, place, and time.  Psychiatric:        Mood and Affect: Mood normal.        Behavior: Behavior normal.    LABORATORY DATA:  I have reviewed the labs as listed.  CBC Latest Ref Rng & Units 03/08/2021 12/19/2020 07/20/2020  WBC 4.0 - 10.5 K/uL 9.7 12.1(H) 10.8(H)  Hemoglobin 12.0 - 15.0 g/dL 13.2 13.5 13.5  Hematocrit 36.0 - 46.0 % 38.5 41.2 40.2  Platelets 150 - 400 K/uL 570(H) 583(H)  559(H)   CMP Latest Ref Rng & Units 03/08/2021 12/19/2020 07/20/2020  Glucose 70 - 99 mg/dL 95 78 87  BUN 6 - 20 mg/dL '7 8 8  '$ Creatinine 0.44 - 1.00 mg/dL 0.55 0.60 0.58  Sodium 135 - 145 mmol/L 134(L) 133(L) 135  Potassium 3.5 - 5.1 mmol/L 3.7 4.4 3.7  Chloride  98 - 111 mmol/L 101 96 100  CO2 22 - 32 mmol/L '24 22 26  '$ Calcium 8.9 - 10.3 mg/dL 9.1 9.9 9.4  Total Protein 6.5 - 8.1 g/dL 7.1 6.8 7.2  Total Bilirubin 0.3 - 1.2 mg/dL 0.2(L) 0.3 0.3  Alkaline Phos 38 - 126 U/L 64 65 53  AST 15 - 41 U/L '19 18 22  '$ ALT 0 - 44 U/L '16 11 17      '$ Component Value Date/Time   RBC 3.86 (L) 03/08/2021 1601   MCV 99.7 03/08/2021 1601   MCV 101 (H) 12/19/2020 1639   MCH 34.2 (H) 03/08/2021 1601   MCHC 34.3 03/08/2021 1601   RDW 12.4 03/08/2021 1601   RDW 12.0 12/19/2020 1639   LYMPHSABS 3.6 03/08/2021 1601   MONOABS 0.6 03/08/2021 1601   EOSABS 1.7 (H) 03/08/2021 1601   BASOSABS 0.1 03/08/2021 1601    DIAGNOSTIC IMAGING:  I have independently reviewed the scans and discussed with the patient. No results found.   ASSESSMENT:  1.  Thrombocytosis: -Testing was negative for Jak 2 V6 45F mutation and BCR/ABL. -She does have a long history of thrombocytosis with platelet count mostly between 500s and 600s. -She has never had a history of thrombosis.  No connective tissue disorder.  Physical exam does not reveal any splenomegaly. -JAK2 exon 12 through 15 were negative.  PML and CAL R mutations were not assessed.     2.  Eosinophilia: -Patient reports she has eczema with frequent flareups.       3.  Macrocytosis: -She has an MCV of 101.7 with normal hemoglobin. -Previous testing for vitamin 123456 and folic acid were negative. -TSH was also normal in November 2019.   4.  Tobacco abuse: -Patient is a current every day smoker with half a pack of cigarettes a day for the past 33 years.   PLAN:  1.    JAK2 V645F negative thrombocytosis: - No prior history of thrombosis or recent infections in  the last 6 months.  No steroid use. - No B symptoms.  No aquagenic pruritus. - Reviewed labs from 03/08/2021.  Platelet count is 570 with normal hematocrit and white count. - RTC 6 months for follow-up.  We will repeat CALR and MPL mutations prior to next visit.   2.  Eosinophilia: - She has elevated absolute eosinophil count for the past few years. - Likely related to eczema on her back. - Use count has been stable around 1700.  No indication for further work-up of hypereosinophilic syndrome.    Orders placed this encounter:  No orders of the defined types were placed in this encounter.    Derek Jack, MD [place] [phone]   I, Thana Ates, am acting as a scribe for Dr. Derek Jack.  I, Derek Jack MD, have reviewed the above documentation for accuracy and completeness, and I agree with the above.

## 2021-03-14 ENCOUNTER — Other Ambulatory Visit: Payer: Self-pay

## 2021-03-14 ENCOUNTER — Inpatient Hospital Stay (HOSPITAL_COMMUNITY): Payer: Federal, State, Local not specified - PPO | Admitting: Hematology

## 2021-03-14 VITALS — BP 127/64 | HR 95 | Temp 96.8°F | Resp 18 | Wt 88.8 lb

## 2021-03-14 DIAGNOSIS — Z803 Family history of malignant neoplasm of breast: Secondary | ICD-10-CM | POA: Diagnosis not present

## 2021-03-14 DIAGNOSIS — D7589 Other specified diseases of blood and blood-forming organs: Secondary | ICD-10-CM | POA: Diagnosis not present

## 2021-03-14 DIAGNOSIS — D721 Eosinophilia, unspecified: Secondary | ICD-10-CM | POA: Diagnosis not present

## 2021-03-14 DIAGNOSIS — Z836 Family history of other diseases of the respiratory system: Secondary | ICD-10-CM | POA: Diagnosis not present

## 2021-03-14 DIAGNOSIS — Z8249 Family history of ischemic heart disease and other diseases of the circulatory system: Secondary | ICD-10-CM | POA: Diagnosis not present

## 2021-03-14 DIAGNOSIS — Z801 Family history of malignant neoplasm of trachea, bronchus and lung: Secondary | ICD-10-CM | POA: Diagnosis not present

## 2021-03-14 DIAGNOSIS — D75839 Thrombocytosis, unspecified: Secondary | ICD-10-CM

## 2021-03-14 DIAGNOSIS — Z9049 Acquired absence of other specified parts of digestive tract: Secondary | ICD-10-CM | POA: Diagnosis not present

## 2021-03-14 DIAGNOSIS — L309 Dermatitis, unspecified: Secondary | ICD-10-CM | POA: Diagnosis not present

## 2021-03-14 DIAGNOSIS — Z79899 Other long term (current) drug therapy: Secondary | ICD-10-CM | POA: Diagnosis not present

## 2021-03-14 DIAGNOSIS — F17210 Nicotine dependence, cigarettes, uncomplicated: Secondary | ICD-10-CM | POA: Diagnosis not present

## 2021-03-14 DIAGNOSIS — Z8349 Family history of other endocrine, nutritional and metabolic diseases: Secondary | ICD-10-CM | POA: Diagnosis not present

## 2021-03-14 DIAGNOSIS — Z833 Family history of diabetes mellitus: Secondary | ICD-10-CM | POA: Diagnosis not present

## 2021-03-14 NOTE — Patient Instructions (Addendum)
[facility] at [facility] Discharge Instructions  You were seen today by Dr. Katragadda. He went over your recent results. Dr. Katragadda will see you back in 6 months for labs and follow up.   Thank you for choosing [facility] at [facility] to provide your oncology and hematology care.  To afford each patient quality time with our provider, please arrive at least 15 minutes before your scheduled appointment time.   If you have a lab appointment with the Cancer Center please come in thru the Main Entrance and check in at the main information desk  You need to re-schedule your appointment should you arrive 10 or more minutes late.  We strive to give you quality time with our providers, and arriving late affects you and other patients whose appointments are after yours.  Also, if you no show three or more times for appointments you may be dismissed from the clinic at the providers discretion.     Again, thank you for choosing [facility].  Our hope is that these requests will decrease the amount of time that you wait before being seen by our physicians.       _____________________________________________________________  Should you have questions after your visit to [facility], please contact our office at [phone] between the hours of 8:00 a.m. and 4:30 p.m.  Voicemails left after 4:00 p.m. will not be returned until the following business day.  For prescription refill requests, have your pharmacy contact our office and allow 72 hours.    Cancer Center Support Programs:   > Cancer Support Group  2nd Tuesday of the month 1pm-2pm, Journey Room    

## 2021-04-05 DIAGNOSIS — K080 Exfoliation of teeth due to systemic causes: Secondary | ICD-10-CM | POA: Diagnosis not present

## 2021-05-22 ENCOUNTER — Other Ambulatory Visit: Payer: Self-pay | Admitting: Adult Health

## 2021-05-22 DIAGNOSIS — Z1231 Encounter for screening mammogram for malignant neoplasm of breast: Secondary | ICD-10-CM

## 2021-06-27 ENCOUNTER — Ambulatory Visit
Admission: RE | Admit: 2021-06-27 | Discharge: 2021-06-27 | Disposition: A | Discharge status: Home / Self Care | Payer: Federal, State, Local not specified - PPO | Source: Ambulatory Visit | Attending: Adult Health | Admitting: Adult Health

## 2021-06-27 DIAGNOSIS — Z1231 Encounter for screening mammogram for malignant neoplasm of breast: Secondary | ICD-10-CM

## 2021-09-12 ENCOUNTER — Inpatient Hospital Stay (HOSPITAL_COMMUNITY): Payer: Federal, State, Local not specified - PPO

## 2021-09-19 ENCOUNTER — Ambulatory Visit (HOSPITAL_COMMUNITY): Payer: Federal, State, Local not specified - PPO | Admitting: Hematology

## 2021-10-23 ENCOUNTER — Inpatient Hospital Stay (HOSPITAL_COMMUNITY): Payer: Federal, State, Local not specified - PPO | Attending: Hematology

## 2021-10-23 DIAGNOSIS — D721 Eosinophilia, unspecified: Secondary | ICD-10-CM | POA: Insufficient documentation

## 2021-10-23 DIAGNOSIS — D75839 Thrombocytosis, unspecified: Secondary | ICD-10-CM | POA: Insufficient documentation

## 2021-10-26 LAB — MPL MUTATION ANALYSIS

## 2021-10-27 LAB — CALRETICULIN (CALR) MUTATION ANALYSIS

## 2021-10-30 ENCOUNTER — Inpatient Hospital Stay (HOSPITAL_COMMUNITY): Payer: Federal, State, Local not specified - PPO

## 2021-10-30 ENCOUNTER — Inpatient Hospital Stay (HOSPITAL_COMMUNITY): Payer: Federal, State, Local not specified - PPO | Attending: Hematology | Admitting: Hematology

## 2021-10-30 VITALS — BP 124/82 | HR 85 | Temp 97.7°F | Resp 16 | Ht 61.02 in | Wt 89.3 lb

## 2021-10-30 DIAGNOSIS — Z9049 Acquired absence of other specified parts of digestive tract: Secondary | ICD-10-CM | POA: Insufficient documentation

## 2021-10-30 DIAGNOSIS — Z8349 Family history of other endocrine, nutritional and metabolic diseases: Secondary | ICD-10-CM | POA: Insufficient documentation

## 2021-10-30 DIAGNOSIS — R590 Localized enlarged lymph nodes: Secondary | ICD-10-CM | POA: Diagnosis not present

## 2021-10-30 DIAGNOSIS — D721 Eosinophilia, unspecified: Secondary | ICD-10-CM

## 2021-10-30 DIAGNOSIS — F17210 Nicotine dependence, cigarettes, uncomplicated: Secondary | ICD-10-CM | POA: Diagnosis not present

## 2021-10-30 DIAGNOSIS — Z79899 Other long term (current) drug therapy: Secondary | ICD-10-CM | POA: Insufficient documentation

## 2021-10-30 DIAGNOSIS — Z803 Family history of malignant neoplasm of breast: Secondary | ICD-10-CM | POA: Insufficient documentation

## 2021-10-30 DIAGNOSIS — D75839 Thrombocytosis, unspecified: Secondary | ICD-10-CM | POA: Insufficient documentation

## 2021-10-30 DIAGNOSIS — I889 Nonspecific lymphadenitis, unspecified: Secondary | ICD-10-CM | POA: Diagnosis not present

## 2021-10-30 DIAGNOSIS — Z833 Family history of diabetes mellitus: Secondary | ICD-10-CM | POA: Insufficient documentation

## 2021-10-30 DIAGNOSIS — Z836 Family history of other diseases of the respiratory system: Secondary | ICD-10-CM | POA: Diagnosis not present

## 2021-10-30 DIAGNOSIS — Z801 Family history of malignant neoplasm of trachea, bronchus and lung: Secondary | ICD-10-CM | POA: Insufficient documentation

## 2021-10-30 DIAGNOSIS — Z8249 Family history of ischemic heart disease and other diseases of the circulatory system: Secondary | ICD-10-CM | POA: Diagnosis not present

## 2021-10-30 LAB — CBC WITH DIFFERENTIAL/PLATELET
Abs Immature Granulocytes: 0.01 10*3/uL (ref 0.00–0.07)
Basophils Absolute: 0.1 10*3/uL (ref 0.0–0.1)
Basophils Relative: 1 %
Eosinophils Absolute: 1.7 10*3/uL — ABNORMAL HIGH (ref 0.0–0.5)
Eosinophils Relative: 18 %
HCT: 38 % (ref 36.0–46.0)
Hemoglobin: 13 g/dL (ref 12.0–15.0)
Immature Granulocytes: 0 %
Lymphocytes Relative: 34 %
Lymphs Abs: 3.3 10*3/uL (ref 0.7–4.0)
MCH: 34.2 pg — ABNORMAL HIGH (ref 26.0–34.0)
MCHC: 34.2 g/dL (ref 30.0–36.0)
MCV: 100 fL (ref 80.0–100.0)
Monocytes Absolute: 0.6 10*3/uL (ref 0.1–1.0)
Monocytes Relative: 6 %
Neutro Abs: 4.1 10*3/uL (ref 1.7–7.7)
Neutrophils Relative %: 41 %
Platelets: 543 10*3/uL — ABNORMAL HIGH (ref 150–400)
RBC: 3.8 MIL/uL — ABNORMAL LOW (ref 3.87–5.11)
RDW: 12.3 % (ref 11.5–15.5)
WBC: 9.9 10*3/uL (ref 4.0–10.5)
nRBC: 0 % (ref 0.0–0.2)

## 2021-10-30 LAB — LACTATE DEHYDROGENASE: LDH: 147 U/L (ref 98–192)

## 2021-10-30 NOTE — Patient Instructions (Addendum)
[place] at [facility] ?Discharge Instructions ? ?You were seen and examined today by Dr. Delton Coombes. ? ?Dr. Delton Coombes discussed your most recent lab work and we did not get your complete blood count results this time.  ? ?We will schedule you for a CT scan sometime in September to further work up your lymph nodes that are slightly enlarged under your arms and in your groin.    ? ?Please keep follow-up appointments as scheduled in . ? ? ? ?Thank you for choosing [place] at [facility] to provide your oncology and hematology care.  To afford each patient quality time with our provider, please arrive at least 15 minutes before your scheduled appointment time.  ? ?If you have a lab appointment with the [facility] please come in thru the Main Entrance and check in at the main information desk. ? ?You need to re-schedule your appointment should you arrive 10 or more minutes late.  We strive to give you quality time with our providers, and arriving late affects you and other patients whose appointments are after yours.  Also, if you no show three or more times for appointments you may be dismissed from the clinic at the providers discretion.     ?Again, thank you for choosing [facility].  Our hope is that these requests will decrease the amount of time that you wait before being seen by our physicians.       ?_____________________________________________________________ ? ?Should you have questions after your visit to [facility], please contact our office at [phone] and follow the prompts.  Our office hours are 8:00 a.m. and 4:30 p.m. Monday - Friday.  Please note that voicemails left after 4:00 p.m. may not be returned until the following business day.  We are closed weekends and major holidays.  You do have access to a nurse 24-7, just call the main number to the clinic [phone] and do not press any options, hold on the line  and a nurse will answer the phone.   ? ?For prescription refill requests, have your pharmacy contact our office and allow 72 hours.   ? ?Due to Covid, you will need to wear a mask upon entering the hospital. If you do not have a mask, a mask will be given to you at the Main Entrance upon arrival. For doctor visits, patients may have 1 support person age 11 or older with them. For treatment visits, patients can not have anyone with them due to social distancing guidelines and our immunocompromised population.  ? ?  ?

## 2021-10-30 NOTE — Progress Notes (Signed)
[facility] [address], [place] [address]   CLINIC:  Medical Oncology/Hematology  PCP:  Patient, No Pcp Per (Inactive) None  None  REASON FOR VISIT:  Follow-up for thrombocytosis and eosinophilia  PRIOR THERAPY: none  CURRENT THERAPY: surveillance  INTERVAL HISTORY:  Sherri Jackson, a 56 y.o. female, returns for routine follow-up for her thrombocytosis and eosinophilia. Sherri Jackson was last seen on 03/14/2021.  Today she reports feeling good. She reports eczema which flares up at night for which she uses Kenalog cream at night. She denies skin rash, n/v/d, tingling/numbness, orthopnea, fevers, night sweats, and weight loss. She does not have pets, and she denies receiving any recent injections in either arm. She denies itching after hot showers.   REVIEW OF SYSTEMS:  Review of Systems  Constitutional:  Negative for appetite change, fatigue, fever and unexpected weight change.  Respiratory:  Negative for shortness of breath.   Gastrointestinal:  Negative for diarrhea, nausea and vomiting.  Endocrine: Negative for hot flashes.  Skin:  Negative for itching and rash.  Neurological:  Negative for numbness.  All other systems reviewed and are negative.  PAST MEDICAL/SURGICAL HISTORY:  Past Medical History:  Diagnosis Date   Elevated MCV 03/20/2016   Will check B12 and folate   Enlarged thyroid 12/01/2013   Peri-menopausal 12/01/2013   Vaginal Pap smear, abnormal    Vitamin D deficiency 03/20/2016   Past Surgical History:  Procedure Laterality Date   ANKLE SURGERY Left    APPENDECTOMY     HYSTEROSCOPY W/ ENDOMETRIAL ABLATION  2004   laser conization of cervix  2004    SOCIAL HISTORY:  Social History   Socioeconomic History   Marital status: Married    Spouse name: Not on file   Number of children: Not on file   Years of education: Not on file   Highest education level: Not on file  Occupational History   Not on file  Tobacco Use   Smoking  status: Every Day    Packs/day: 0.50    Years: 33.00    Pack years: 16.50    Types: Cigarettes   Smokeless tobacco: Never  Vaping Use   Vaping Use: Never used  Substance and Sexual Activity   Alcohol use: Yes    Comment: occ   Drug use: No   Sexual activity: Yes    Birth control/protection: Post-menopausal  Other Topics Concern   Not on file  Social History Narrative   Not on file   Social Determinants of Health   Financial Resource Strain: Low Risk    Difficulty of Paying Living Expenses: Not hard at all  Food Insecurity: No Food Insecurity   Worried About Programme researcher, broadcasting/film/video in the Last Year: Never true   Barista in the Last Year: Never true  Transportation Needs: No Transportation Needs   Lack of Transportation (Medical): No   Lack of Transportation (Non-Medical): No  Physical Activity: Sufficiently Active   Days of Exercise per Week: 5 days   Minutes of Exercise per Session: 60 min  Stress: Stress Concern Present   Feeling of Stress : To some extent  Social Connections: Socially Isolated   Frequency of Communication with Friends and Family: Once a week   Frequency of Social Gatherings with Friends and Family: Once a week   Attends Religious Services: Never   Database administrator or Organizations: No   Attends Banker Meetings: Patient refused  Marital Status: Married  Catering manager Violence: Not At Risk   Fear of Current or Ex-Partner: No   Emotionally Abused: No   Physically Abused: No   Sexually Abused: No    FAMILY HISTORY:  Family History  Problem Relation Age of Onset   Cancer Father        lung   COPD Father    Emphysema Father    Hyperlipidemia Father    Hypertension Father    Thyroid disease Father    Heart disease Paternal Grandmother    Heart attack Paternal Grandfather    Diabetes Paternal Uncle    Breast cancer Cousin    Breast cancer Cousin     CURRENT MEDICATIONS:  Current Outpatient Medications   Medication Sig Dispense Refill   Biotin 5 MG TABS      cetirizine (ZYRTEC) 10 MG tablet Take 10 mg by mouth daily.     ibuprofen (ADVIL,MOTRIN) 200 MG tablet Take 400 mg by mouth as needed.     LORazepam (ATIVAN) 0.5 MG tablet Take 1 every 12 hours as needed 30 tablet 0   Multiple Vitamin (MULTIVITAMIN) tablet Take 1 tablet by mouth daily.     triamcinolone cream (KENALOG) 0.1 % Apply 1 application topically 2 (two) times daily.     vitamin B-12 (CYANOCOBALAMIN) 100 MCG tablet      VITAMIN D, ERGOCALCIFEROL, PO      No current facility-administered medications for this visit.    ALLERGIES:  No Known Allergies  PHYSICAL EXAM:  Performance status (ECOG): 1 - Symptomatic but completely ambulatory  Vitals:   10/30/21 1432  BP: 124/82  Pulse: 85  Resp: 16  Temp: 97.7 F (36.5 C)  SpO2: 100%   Wt Readings from Last 3 Encounters:  10/30/21 89 lb 4.6 oz (40.5 kg)  03/14/21 88 lb 12.8 oz (40.3 kg)  12/19/20 89 lb (40.4 kg)   Physical Exam Vitals reviewed.  Constitutional:      Appearance: Normal appearance.  Cardiovascular:     Rate and Rhythm: Normal rate and regular rhythm.     Pulses: Normal pulses.     Heart sounds: Normal heart sounds.  Pulmonary:     Effort: Pulmonary effort is normal.     Breath sounds: Normal breath sounds.  Abdominal:     Palpations: Abdomen is soft. There is no hepatomegaly, splenomegaly or mass.     Tenderness: There is no abdominal tenderness.  Lymphadenopathy:     Cervical: No cervical adenopathy.     Right cervical: No superficial, deep or posterior cervical adenopathy.    Left cervical: No superficial, deep or posterior cervical adenopathy.     Upper Body:     Right upper body: Axillary adenopathy (sub cm) present. No supraclavicular adenopathy.     Left upper body: Axillary adenopathy (sub cm) present. No supraclavicular adenopathy.     Lower Body: Right inguinal adenopathy (sub cm) present. Left inguinal adenopathy (sub cm) present.   Neurological:     General: No focal deficit present.     Mental Status: She is alert and oriented to person, place, and time.  Psychiatric:        Mood and Affect: Mood normal.        Behavior: Behavior normal.    LABORATORY DATA:  I have reviewed the labs as listed.     Latest Ref Rng & Units 03/08/2021    4:01 PM 12/19/2020    4:39 PM 07/20/2020    3:28 PM  CBC  WBC 4.0 - 10.5 K/uL 9.7   12.1   10.8    Hemoglobin 12.0 - 15.0 g/dL 16.1   09.6   04.5    Hematocrit 36.0 - 46.0 % 38.5   41.2   40.2    Platelets 150 - 400 K/uL 570   583   559        Latest Ref Rng & Units 03/08/2021    4:01 PM 12/19/2020    4:39 PM 07/20/2020    3:28 PM  CMP  Glucose 70 - 99 mg/dL 95   78   87    BUN 6 - 20 mg/dL 7   8   8     Creatinine 0.44 - 1.00 mg/dL 4.09   8.11   9.14    Sodium 135 - 145 mmol/L 134   133   135    Potassium 3.5 - 5.1 mmol/L 3.7   4.4   3.7    Chloride 98 - 111 mmol/L 101   96   100    CO2 22 - 32 mmol/L 24   22   26     Calcium 8.9 - 10.3 mg/dL 9.1   9.9   9.4    Total Protein 6.5 - 8.1 g/dL 7.1   6.8   7.2    Total Bilirubin 0.3 - 1.2 mg/dL 0.2   0.3   0.3    Alkaline Phos 38 - 126 U/L 64   65   53    AST 15 - 41 U/L 19   18   22     ALT 0 - 44 U/L 16   11   17         Component Value Date/Time   RBC 3.86 (L) 03/08/2021 1601   MCV 99.7 03/08/2021 1601   MCV 101 (H) 12/19/2020 1639   MCH 34.2 (H) 03/08/2021 1601   MCHC 34.3 03/08/2021 1601   RDW 12.4 03/08/2021 1601   RDW 12.0 12/19/2020 1639   LYMPHSABS 3.6 03/08/2021 1601   MONOABS 0.6 03/08/2021 1601   EOSABS 1.7 (H) 03/08/2021 1601   BASOSABS 0.1 03/08/2021 1601    DIAGNOSTIC IMAGING:  I have independently reviewed the scans and discussed with the patient. No results found.   ASSESSMENT:  1.  Thrombocytosis: -Testing was negative for Jak 2 V6 51F mutation and BCR/ABL. -She does have a long history of thrombocytosis with platelet count mostly between 500s and 600s. -She has never had a history of thrombosis.   No connective tissue disorder.  Physical exam does not reveal any splenomegaly. -JAK2 exon 12 through 15 were negative.  PML and CAL R mutations were not assessed.     2.  Eosinophilia: -Patient reports she has eczema with frequent flareups.       3.  Macrocytosis: -She has an MCV of 101.7 with normal hemoglobin. -Previous testing for vitamin B12 and folic acid were negative. -TSH was also normal in November 2019.   4.  Tobacco abuse: -Patient is a current every day smoker with half a pack of cigarettes a day for the past 33 years.   PLAN:  1.    JAK2 V651F negative thrombocytosis: - No prior history of thrombosis or recent infections. - She uses triamcinolone 0.1% cream daily at night under the breast region for eczema. - CBC today shows platelet count 543 and stable.  We have reviewed CALR and MPL mutation testing results which were negative. - If there is any worsening, consider bone marrow  biopsy.   2.  Eosinophilia: - She has elevated absolute eosinophil count for the past few years. - She does not have any clinical signs or symptoms of endorgan involvement. - CBC today shows white count 9.9 with absolute eosinophil count 1.7 and stable. - She does have several shotty lymph nodes in the bilateral axillary regions and inguinal regions.  No B symptoms.  No recurrent infections. - We will obtain CT imaging of the chest, abdomen and pelvis to evaluate further for eosinophilia as well as lymphadenopathy. - She would like to have CT imaging done in September as her summer schedule is very booked up.  As she is not having any symptoms, I think it is reasonable to have it done in September.  Orders placed this encounter:  No orders of the defined types were placed in this encounter.    Doreatha Massed, MD [facility] [phone]   I, Alda Ponder, am acting as a scribe for Dr. Doreatha Massed.  I, Doreatha Massed MD, have reviewed the above  documentation for accuracy and completeness, and I agree with the above.

## 2021-10-31 ENCOUNTER — Telehealth: Payer: Self-pay

## 2021-10-31 DIAGNOSIS — E789 Disorder of lipoprotein metabolism, unspecified: Secondary | ICD-10-CM

## 2021-10-31 DIAGNOSIS — Z01419 Encounter for gynecological examination (general) (routine) without abnormal findings: Secondary | ICD-10-CM

## 2021-10-31 DIAGNOSIS — Z1159 Encounter for screening for other viral diseases: Secondary | ICD-10-CM

## 2021-10-31 NOTE — Telephone Encounter (Signed)
PT CALLED AND STATED THAT SHE WOULD LIKE BLOOD WORK DONE BEFORE HER APPOINTMENT ON 01/22/22 @ 8:30 AM, IF SOMEONE COULD PUT IN THE ORDERS. ?

## 2021-11-02 NOTE — Telephone Encounter (Signed)
Will check CMP,TSH and LIPIDS and hepatitis C antibody, had CBC with Dr Raliegh Ip. ?

## 2022-01-22 ENCOUNTER — Other Ambulatory Visit: Payer: Federal, State, Local not specified - PPO | Admitting: Adult Health

## 2022-02-22 IMAGING — MG MM DIGITAL SCREENING BILAT W/ TOMO AND CAD
6 of 12 series · 6 of 36 positions shown · non-contrast
Comparison: Previous exam(s).

CLINICAL DATA: Screening.

EXAM:
DIGITAL SCREENING BILATERAL MAMMOGRAM WITH TOMOSYNTHESIS AND CAD
TECHNIQUE: Bilateral screening digital craniocaudal and mediolateral oblique
mammograms were obtained. Bilateral screening digital breast
tomosynthesis was performed. The images were evaluated with
computer-aided detection.

[R CC synth-2D]
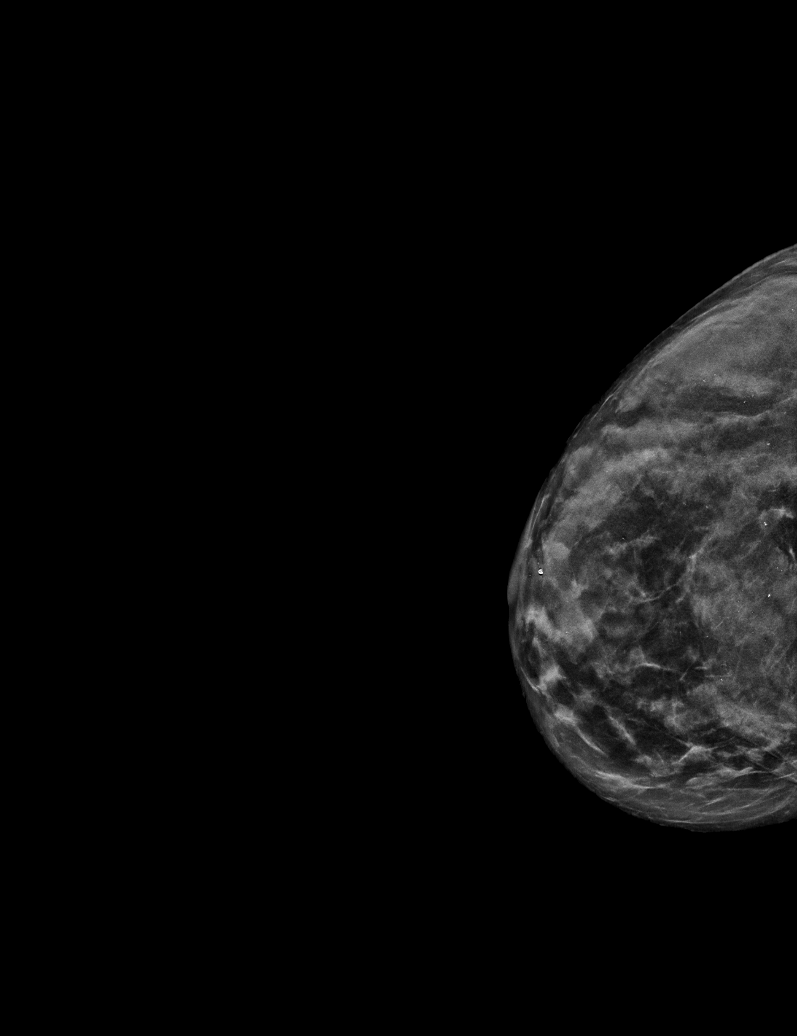

[L MLO synth-2D (1 of 3)]
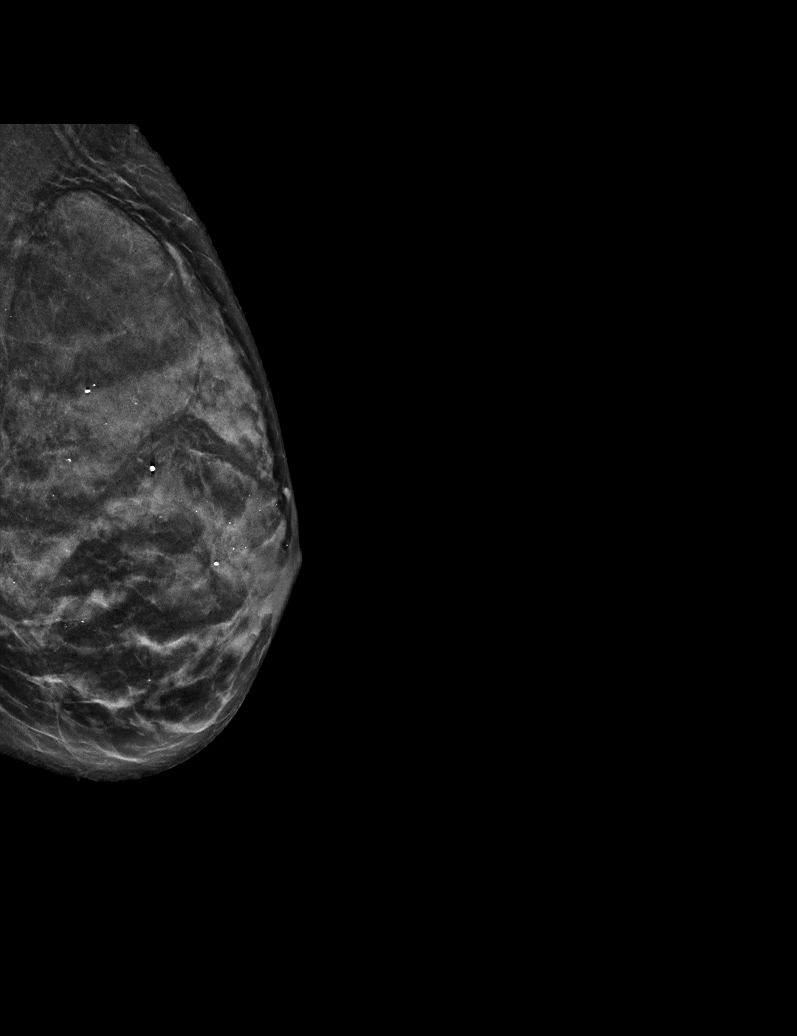

[L MLO synth-2D (2 of 3)]
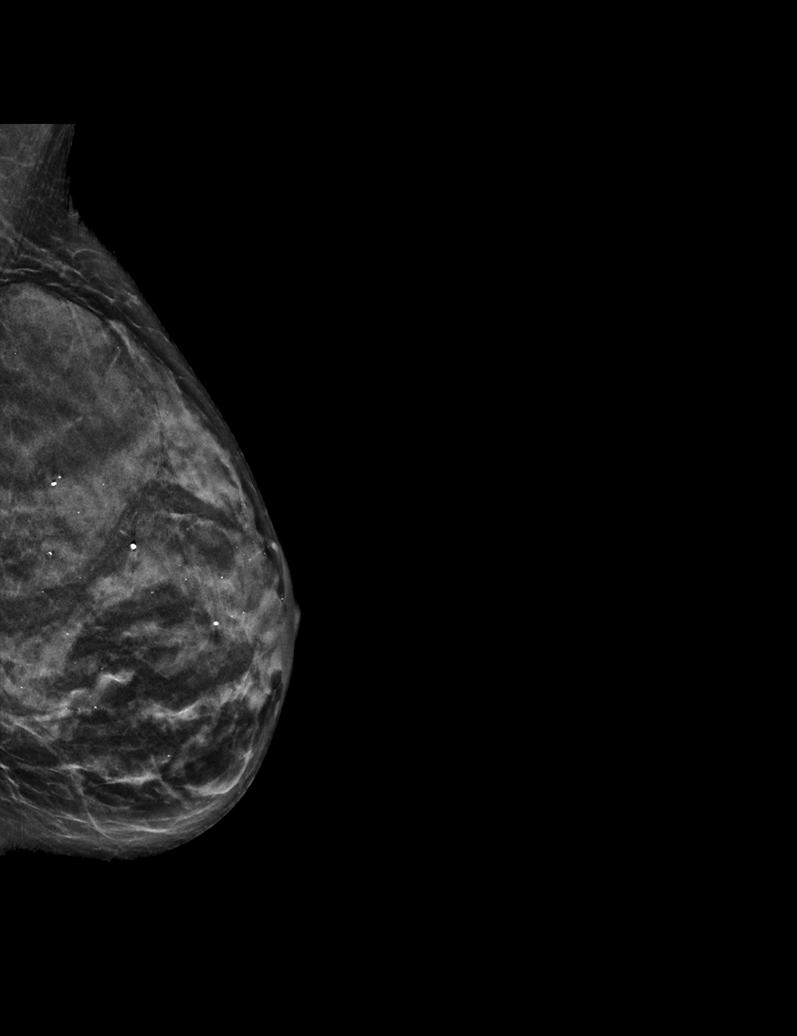

[L MLO synth-2D (3 of 3)]
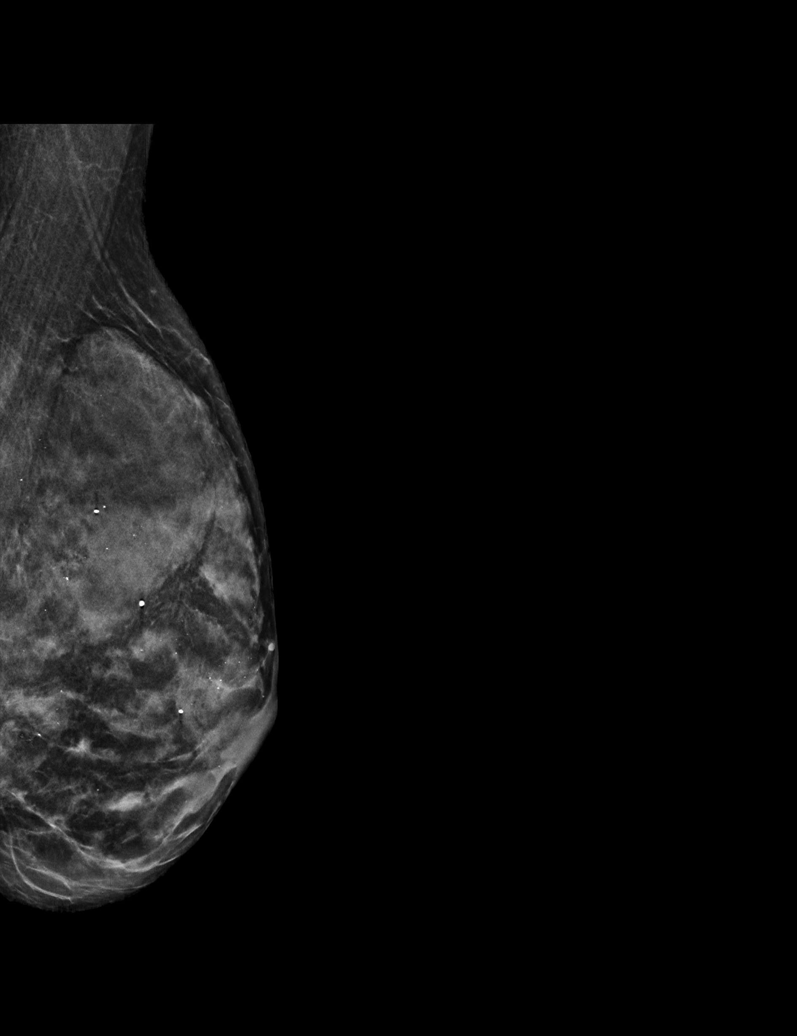

[R MLO synth-2D]
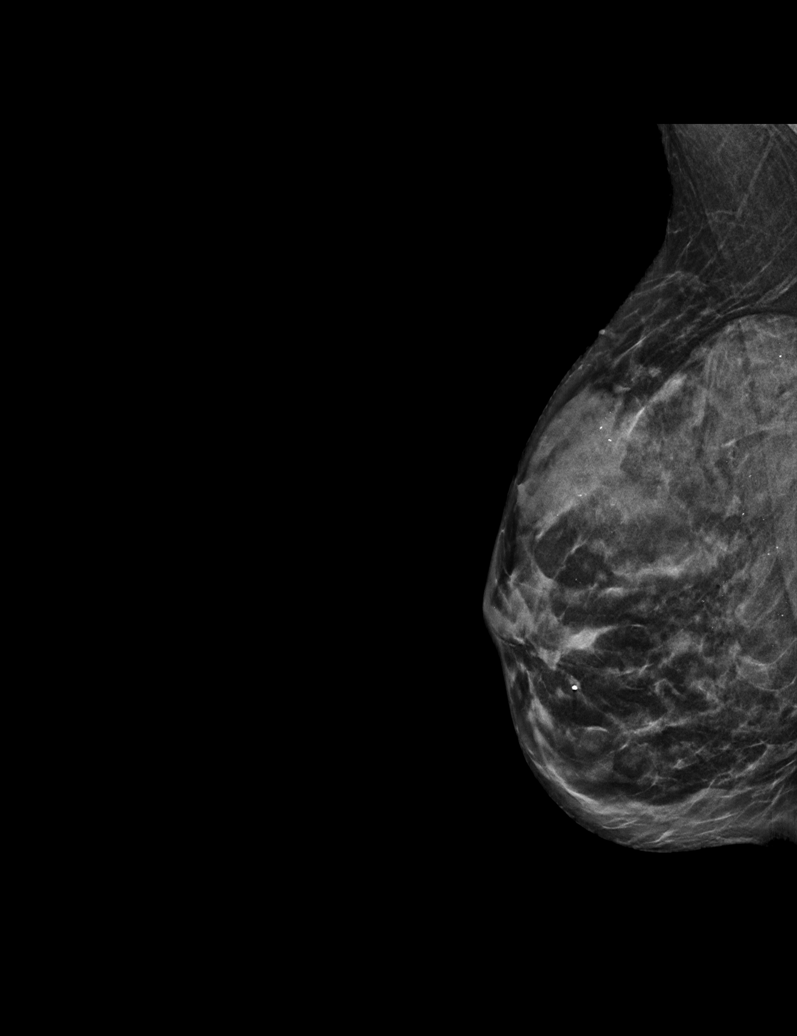

[L CC synth-2D]
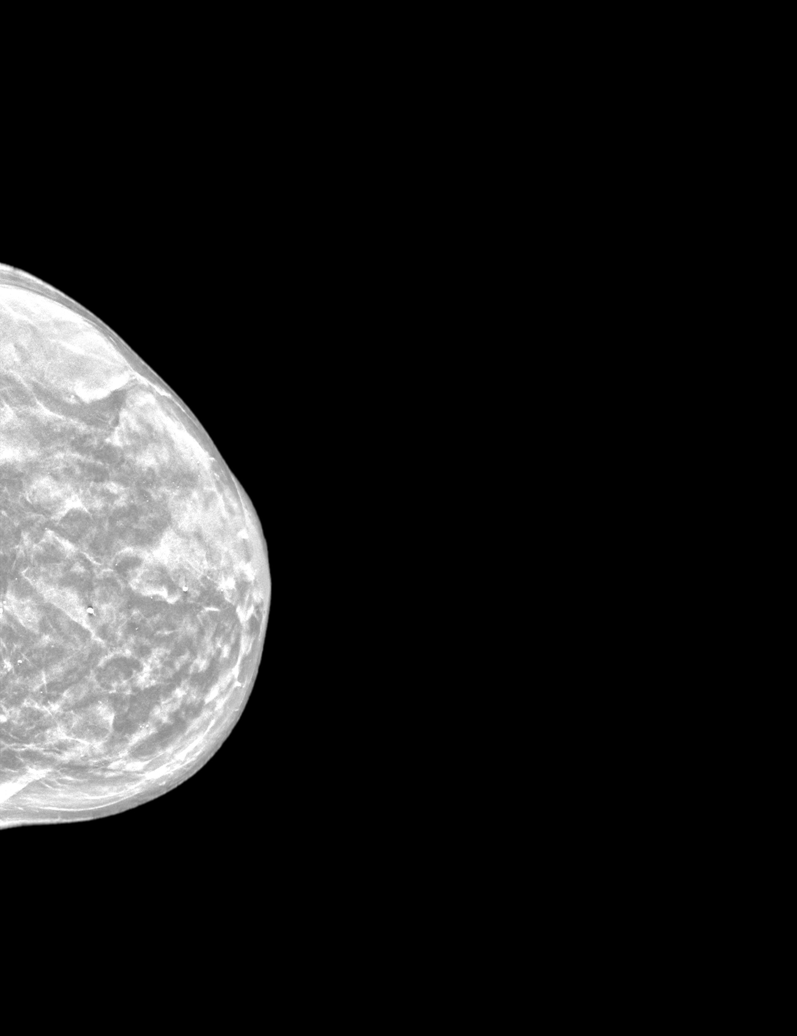

[6 of 36 positions shown; findings below may reference images not displayed]

ACR Breast Density Category d: The breast tissue is extremely dense,
which lowers the sensitivity of mammography
FINDINGS: There are no findings suspicious for malignancy.
IMPRESSION: No mammographic evidence of malignancy. A result letter of this
screening mammogram will be mailed directly to the patient.

RECOMMENDATION:
Screening mammogram in one year. (Code:TA-V-WV9)

BI-RADS CATEGORY  1: Negative.

## 2022-02-28 ENCOUNTER — Ambulatory Visit (INDEPENDENT_AMBULATORY_CARE_PROVIDER_SITE_OTHER): Payer: Federal, State, Local not specified - PPO | Admitting: Adult Health

## 2022-02-28 ENCOUNTER — Encounter: Payer: Self-pay | Admitting: Adult Health

## 2022-02-28 VITALS — BP 135/86 | HR 75 | Ht 59.25 in | Wt 88.0 lb

## 2022-02-28 DIAGNOSIS — Z1159 Encounter for screening for other viral diseases: Secondary | ICD-10-CM | POA: Diagnosis not present

## 2022-02-28 DIAGNOSIS — Z1211 Encounter for screening for malignant neoplasm of colon: Secondary | ICD-10-CM

## 2022-02-28 DIAGNOSIS — F419 Anxiety disorder, unspecified: Secondary | ICD-10-CM

## 2022-02-28 DIAGNOSIS — E789 Disorder of lipoprotein metabolism, unspecified: Secondary | ICD-10-CM | POA: Diagnosis not present

## 2022-02-28 DIAGNOSIS — Z780 Asymptomatic menopausal state: Secondary | ICD-10-CM

## 2022-02-28 DIAGNOSIS — Z01419 Encounter for gynecological examination (general) (routine) without abnormal findings: Secondary | ICD-10-CM | POA: Diagnosis not present

## 2022-02-28 LAB — HEMOCCULT GUIAC POC 1CARD (OFFICE): Fecal Occult Blood, POC: NEGATIVE

## 2022-02-28 MED ORDER — LORAZEPAM 0.5 MG PO TABS: ORAL_TABLET | ORAL | 0 refills | Status: DC

## 2022-02-28 NOTE — Progress Notes (Signed)
Patient ID: Sherri Jackson, female   DOB: [birth year], 56 y.o.   MRN: [id number] History of Present Illness: Sherri Jackson is a 56 year old white female,married, PM in for a well woman gyn exam. She had fsting labs this morning. She still sees Dr Sherri Jackson and getting CT in September.  Last pap was 518/21 and was normal with negative HPV.   Current Medications, Allergies, Past Medical History, Past Surgical History, Family History and Social History were reviewed in [employer] record.     Review of Systems: Patient denies any headaches, hearing loss, fatigue, blurred vision, shortness of breath, chest pain, abdominal pain, problems with bowel movements, urination, or intercourse. No joint pain or mood swings.  She denies any vaginal bleeding    Physical Exam:BP 135/86 (BP Location: Left Arm, Patient Position: Sitting, Cuff Size: Normal)   Pulse 75   Ht 4' 11.25" (1.505 m)   Wt 88 lb (39.9 kg)   LMP 10/01/2013 Comment: bleeding 09/2016  BMI 17.62 kg/m   General:  Well developed, well nourished, no acute distress Skin:  Warm and dry Neck:  Midline trachea, normal thyroid, good ROM, no lymphadenopathy Lungs; Clear to auscultation bilaterally Breast:  No dominant palpable mass, retraction, or nipple discharge Cardiovascular: Regular rate and rhythm Abdomen:  Soft, non tender, no hepatosplenomegaly Pelvic:  External genitalia is normal in appearance, no lesions.  The vagina is pale with loss of rugae. Urethra has no lesions or masses. The cervix is smooth.  Uterus is felt to be normal size, shape, and contour.  No adnexal masses or tenderness noted.Bladder is non tender, no masses felt. Rectal: Good sphincter tone, no polyps, or hemorrhoids felt.  Hemoccult negative. Extremities/musculoskeletal:  No swelling or varicosities noted, no clubbing or cyanosis Psych:  No mood changes, alert and cooperative,seems happy AA is 3 Fall risk is low    02/28/2022    8:34 AM 12/19/2020    2:49  PM 11/17/2019    8:33 AM  Depression screen PHQ 2/9  Decreased Interest 0 0 0  Down, Depressed, Hopeless 0 0 0  PHQ - 2 Score 0 0 0  Altered sleeping 0 0 0  Tired, decreased energy 0 0 0  Change in appetite 0 0 0  Feeling bad or failure about yourself  0 0 0  Trouble concentrating 0 0 0  Moving slowly or fidgety/restless 0 0 0  Suicidal thoughts 0 0 0  PHQ-9 Score 0 0 0       02/28/2022    8:34 AM 12/19/2020    2:50 PM 11/17/2019    8:34 AM  GAD 7 : Generalized Anxiety Score  Nervous, Anxious, on Edge 0 0 1  Control/stop worrying 0 0 1  Worry too much - different things 0 0 1  Trouble relaxing 0 0 1  Restless 0 0 1  Easily annoyed or irritable 0 0 1  Afraid - awful might happen 0 0 1  Total GAD 7 Score 0 0 7      Upstream - 02/28/22 0840       Pregnancy Intention Screening   Does the patient want to become pregnant in the next year? N/A    Does the patient's partner want to become pregnant in the next year? N/A    Would the patient like to discuss contraceptive options today? N/A      Contraception Wrap Up   Current Method No Method - Other Reason   postmenopausal   End Method  No Method - Other Reason   postmenopausal   Contraception Counseling Provided No            Examination chaperoned by Levy Pupa LPN   Impression and Plan: 1. Encounter for well woman exam with routine gynecological exam Pap and physical in 1 year Mammogram was normal 06/27/21 Colonoscopy due 2028 Had labs this am  2. Encounter for screening fecal occult blood testing Hemoccult was negative   3. Postmenopausal Denies any vaginal bleeding   4. Anxiety She requests refill on ativan, LR a year ago Meds ordered this encounter  Medications   LORazepam (ATIVAN) 0.5 MG tablet    Sig: Take 1 every 12 hours as needed    Dispense:  30 tablet    Refill:  0    Order Specific Question:   Supervising Provider    Answer:   Tania Ade H [[id number]]

## 2022-03-01 LAB — COMPREHENSIVE METABOLIC PANEL
ALT: 18 IU/L (ref 0–32)
AST: 25 IU/L (ref 0–40)
Albumin/Globulin Ratio: 2.1 (ref 1.2–2.2)
Albumin: 4.8 g/dL (ref 3.8–4.9)
Alkaline Phosphatase: 71 IU/L (ref 44–121)
BUN/Creatinine Ratio: 10 (ref 9–23)
BUN: 7 mg/dL (ref 6–24)
Bilirubin Total: 0.3 mg/dL (ref 0.0–1.2)
CO2: 22 mmol/L (ref 20–29)
Calcium: 10.4 mg/dL — ABNORMAL HIGH (ref 8.7–10.2)
Chloride: 98 mmol/L (ref 96–106)
Creatinine, Ser: 0.67 mg/dL (ref 0.57–1.00)
Globulin, Total: 2.3 g/dL (ref 1.5–4.5)
Glucose: 90 mg/dL (ref 70–99)
Potassium: 4.7 mmol/L (ref 3.5–5.2)
Sodium: 138 mmol/L (ref 134–144)
Total Protein: 7.1 g/dL (ref 6.0–8.5)
eGFR: 103 mL/min/{1.73_m2} (ref >59)

## 2022-03-01 LAB — TSH: TSH: 1.07 u[IU]/mL (ref 0.450–4.500)

## 2022-03-01 LAB — LIPID PANEL
Chol/HDL Ratio: 3.2 ratio (ref 0.0–4.4)
Cholesterol, Total: 222 mg/dL — ABNORMAL HIGH (ref 100–199)
HDL: 70 mg/dL (ref >39)
LDL Chol Calc (NIH): 134 mg/dL — ABNORMAL HIGH (ref 0–99)
Triglycerides: 102 mg/dL (ref 0–149)
VLDL Cholesterol Cal: 18 mg/dL (ref 5–40)

## 2022-03-01 LAB — HEPATITIS C ANTIBODY: Hep C Virus Ab: NONREACTIVE

## 2022-03-15 ENCOUNTER — Telehealth: Payer: Self-pay | Admitting: *Deleted

## 2022-03-15 NOTE — Telephone Encounter (Signed)
Offered to reschedule patients 03/19/22 lab appointment to resolve scheduling conflict and patient refused due to work schedule.  Explained to patient that scheduled labs were ordered for her 03/26/22 office visit and that if she was unable to get them that she could discuss it with Dr. Delton Coombes at the time of this visit.  Patient agreed with this and opted to cancel her lab appointment at this time.

## 2022-03-19 ENCOUNTER — Ambulatory Visit
Admission: RE | Admit: 2022-03-19 | Discharge: 2022-03-19 | Disposition: A | Discharge status: Home / Self Care | Payer: Federal, State, Local not specified - PPO | Source: Ambulatory Visit | Attending: Hematology | Admitting: Hematology

## 2022-03-19 ENCOUNTER — Inpatient Hospital Stay: Payer: Federal, State, Local not specified - PPO

## 2022-03-19 DIAGNOSIS — R590 Localized enlarged lymph nodes: Secondary | ICD-10-CM | POA: Diagnosis not present

## 2022-03-19 DIAGNOSIS — D721 Eosinophilia, unspecified: Secondary | ICD-10-CM | POA: Insufficient documentation

## 2022-03-19 DIAGNOSIS — D75839 Thrombocytosis, unspecified: Secondary | ICD-10-CM | POA: Diagnosis not present

## 2022-03-19 DIAGNOSIS — D696 Thrombocytopenia, unspecified: Secondary | ICD-10-CM | POA: Diagnosis not present

## 2022-03-19 DIAGNOSIS — I889 Nonspecific lymphadenitis, unspecified: Secondary | ICD-10-CM | POA: Diagnosis not present

## 2022-03-19 DIAGNOSIS — Z8679 Personal history of other diseases of the circulatory system: Secondary | ICD-10-CM | POA: Diagnosis not present

## 2022-03-19 MED ORDER — IOHEXOL 300 MG/ML  SOLN
80.0000 mL | Freq: Once | INTRAMUSCULAR | Status: AC | PRN
  Administered 2022-03-19: 80 mL via INTRAVENOUS

## 2022-03-26 ENCOUNTER — Inpatient Hospital Stay: Payer: Federal, State, Local not specified - PPO | Attending: Hematology | Admitting: Hematology

## 2022-03-26 VITALS — BP 132/85 | HR 101 | Temp 98.4°F | Resp 18 | Ht 59.0 in | Wt 89.7 lb

## 2022-03-26 DIAGNOSIS — F17210 Nicotine dependence, cigarettes, uncomplicated: Secondary | ICD-10-CM | POA: Diagnosis not present

## 2022-03-26 DIAGNOSIS — D721 Eosinophilia, unspecified: Secondary | ICD-10-CM | POA: Diagnosis not present

## 2022-03-26 DIAGNOSIS — D75839 Thrombocytosis, unspecified: Secondary | ICD-10-CM | POA: Diagnosis not present

## 2022-03-26 NOTE — Progress Notes (Signed)
[place], [place] [address]   CLINIC:  Medical Oncology/Hematology  PCP:  Patient, No Pcp Per None  None  REASON FOR VISIT:  Follow-up for thrombocytosis and eosinophilia  PRIOR THERAPY: none  CURRENT THERAPY: surveillance  INTERVAL HISTORY:  [name], a 56 y.o. female, seen for follow-up of thrombocytosis and eosinophilia.  Denies any fevers, night sweats or weight loss.  Denies any infections in the last 6 months.  She reports that her eczematous rash on the back is usually worse in the summer sun gets better as the temperature is cool down.  She is using Kenalog cream daily.  Denies any aquagenic pruritus or vasomotor symptoms.  No prior history of thrombosis.  REVIEW OF SYSTEMS:  Review of Systems  Skin:  Positive for rash. Negative for itching.  All other systems reviewed and are negative.   PAST MEDICAL/SURGICAL HISTORY:  Past Medical History:  Diagnosis Date   Elevated MCV 03/20/2016   Will check B12 and folate   Enlarged thyroid 12/01/2013   Peri-menopausal 12/01/2013   Vaginal Pap smear, abnormal    Vitamin D deficiency 03/20/2016   Past Surgical History:  Procedure Laterality Date   ANKLE SURGERY Left    APPENDECTOMY     HYSTEROSCOPY W/ ENDOMETRIAL ABLATION  2004   laser conization of cervix  2004    SOCIAL HISTORY:  Social History   Socioeconomic History   Marital status: Married    Spouse name: Not on file   Number of children: Not on file   Years of education: Not on file   Highest education level: Not on file  Occupational History   Not on file  Tobacco Use   Smoking status: Every Day    Packs/day: 0.50    Years: 33.00    Total pack years: 16.50    Types: Cigarettes   Smokeless tobacco: Never  Vaping Use   Vaping Use: Never used  Substance and Sexual Activity   Alcohol use: Yes    Comment: occ   Drug use: No   Sexual activity: Yes    Birth control/protection: Post-menopausal  Other  Topics Concern   Not on file  Social History Narrative   Not on file   Social Determinants of Health   Financial Resource Strain: Low Risk  (02/28/2022)   Overall Financial Resource Strain (CARDIA)    Difficulty of Paying Living Expenses: Not hard at all  Food Insecurity: No Food Insecurity (02/28/2022)   Hunger Vital Sign    Worried About Running Out of Food in the Last Year: Never true    Ran Out of Food in the Last Year: Never true  Transportation Needs: No Transportation Needs (02/28/2022)   PRAPARE - Hydrologist (Medical): No    Lack of Transportation (Non-Medical): No  Physical Activity: Sufficiently Active (02/28/2022)   Exercise Vital Sign    Days of Exercise per Week: 6 days    Minutes of Exercise per Session: 40 min  Stress: Stress Concern Present (02/28/2022)   [place]    Feeling of Stress : To some extent  Social Connections: Unknown (02/28/2022)   Social Connection and Isolation Panel [NHANES]    Frequency of Communication with Friends and Family: Twice a week    Frequency of Social Gatherings with Friends and Family: Once a week    Attends Religious Services: Patient refused    Active  Member of Clubs or Organizations: Patient refused    Attends Archivist Meetings: Patient refused    Marital Status: Married  Human resources officer Violence: Not At Risk (02/28/2022)   Humiliation, Afraid, Rape, and Kick questionnaire    Fear of Current or Ex-Partner: No    Emotionally Abused: No    Physically Abused: No    Sexually Abused: No    FAMILY HISTORY:  Family History  Problem Relation Age of Onset   Cancer Father        lung   COPD Father    Emphysema Father    Hyperlipidemia Father    Hypertension Father    Thyroid disease Father    Heart disease Paternal Grandmother    Heart attack Paternal Grandfather    Diabetes Paternal Uncle    Breast cancer Cousin     Breast cancer Cousin     CURRENT MEDICATIONS:  Current Outpatient Medications  Medication Sig Dispense Refill   Acetaminophen (TYLENOL PO) Take by mouth.     Biotin 5 MG TABS      cetirizine (ZYRTEC) 10 MG tablet Take 10 mg by mouth daily.     ibuprofen (ADVIL,MOTRIN) 200 MG tablet Take 400 mg by mouth as needed.     LORazepam (ATIVAN) 0.5 MG tablet Take 1 every 12 hours as needed 30 tablet 0   Multiple Vitamin (MULTIVITAMIN) tablet Take 1 tablet by mouth daily.     triamcinolone cream (KENALOG) 0.1 % Apply 1 application topically 2 (two) times daily.     vitamin B-12 (CYANOCOBALAMIN) 100 MCG tablet      VITAMIN D, ERGOCALCIFEROL, PO      No current facility-administered medications for this visit.    ALLERGIES:  No Known Allergies  PHYSICAL EXAM:  Performance status (ECOG): 1 - Symptomatic but completely ambulatory  Vitals:   03/26/22 1446  BP: 132/85  Pulse: (!) 101  Resp: 18  Temp: 98.4 F (36.9 C)  SpO2: 100%   Wt Readings from Last 3 Encounters:  03/26/22 89 lb 11.2 oz (40.7 kg)  02/28/22 88 lb (39.9 kg)  10/30/21 89 lb 4.6 oz (40.5 kg)   Physical Exam Vitals reviewed.  Constitutional:      Appearance: Normal appearance.  Cardiovascular:     Rate and Rhythm: Normal rate and regular rhythm.     Pulses: Normal pulses.     Heart sounds: Normal heart sounds.  Pulmonary:     Effort: Pulmonary effort is normal.     Breath sounds: Normal breath sounds.  Abdominal:     Palpations: Abdomen is soft. There is no hepatomegaly, splenomegaly or mass.     Tenderness: There is no abdominal tenderness.  Lymphadenopathy:     Cervical: No cervical adenopathy.     Right cervical: No superficial, deep or posterior cervical adenopathy.    Left cervical: No superficial, deep or posterior cervical adenopathy.     Upper Body:     Right upper body: Axillary adenopathy (sub cm) present. No supraclavicular adenopathy.     Left upper body: Axillary adenopathy (sub cm) present. No  supraclavicular adenopathy.     Lower Body: Right inguinal adenopathy (sub cm) present. Left inguinal adenopathy (sub cm) present.  Neurological:     General: No focal deficit present.     Mental Status: She is alert and oriented to person, place, and time.  Psychiatric:        Mood and Affect: Mood normal.        Behavior:  Behavior normal.     LABORATORY DATA:  I have reviewed the labs as listed.     Latest Ref Rng & Units 10/30/2021    3:26 PM 03/08/2021    4:01 PM 12/19/2020    4:39 PM  CBC  WBC 4.0 - 10.5 K/uL 9.9  9.7  12.1   Hemoglobin 12.0 - 15.0 g/dL 13.0  13.2  13.5   Hematocrit 36.0 - 46.0 % 38.0  38.5  41.2   Platelets 150 - 400 K/uL 543  570  583       Latest Ref Rng & Units 02/28/2022    8:11 AM 03/08/2021    4:01 PM 12/19/2020    4:39 PM  CMP  Glucose 70 - 99 mg/dL 90  95  78   BUN 6 - 24 mg/dL '7  7  8   ' Creatinine 0.57 - 1.00 mg/dL 0.67  0.55  0.60   Sodium 134 - 144 mmol/L 138  134  133   Potassium 3.5 - 5.2 mmol/L 4.7  3.7  4.4   Chloride 96 - 106 mmol/L 98  101  96   CO2 20 - 29 mmol/L '22  24  22   ' Calcium 8.7 - 10.2 mg/dL 10.4  9.1  9.9   Total Protein 6.0 - 8.5 g/dL 7.1  7.1  6.8   Total Bilirubin 0.0 - 1.2 mg/dL 0.3  0.2  0.3   Alkaline Phos 44 - 121 IU/L 71  64  65   AST 0 - 40 IU/L '25  19  18   ' ALT 0 - 32 IU/L '18  16  11       ' Component Value Date/Time   RBC 3.80 (L) 10/30/2021 1526   MCV 100.0 10/30/2021 1526   MCV 101 (H) 12/19/2020 1639   MCH 34.2 (H) 10/30/2021 1526   MCHC 34.2 10/30/2021 1526   RDW 12.3 10/30/2021 1526   RDW 12.0 12/19/2020 1639   LYMPHSABS 3.3 10/30/2021 1526   MONOABS 0.6 10/30/2021 1526   EOSABS 1.7 (H) 10/30/2021 1526   BASOSABS 0.1 10/30/2021 1526    DIAGNOSTIC IMAGING:  I have independently reviewed the scans and discussed with the patient. CT CHEST ABDOMEN PELVIS W CONTRAST  Result Date: 03/20/2022 CLINICAL DATA:  Nonspecific lymphadenitis. Lymphadenopathy in the axilla and groin region. Elevated platelets. *  Tracking Code: BO * EXAM: CT CHEST, ABDOMEN, AND PELVIS WITH CONTRAST TECHNIQUE: Multidetector CT imaging of the chest, abdomen and pelvis was performed following the standard protocol during bolus administration of intravenous contrast. RADIATION DOSE REDUCTION: This exam was performed according to the departmental dose-optimization program which includes automated exposure control, adjustment of the mA and/or kV according to patient size and/or use of iterative reconstruction technique. CONTRAST:  32m OMNIPAQUE IOHEXOL 300 MG/ML  SOLN COMPARISON:  None Available. FINDINGS: CT CHEST FINDINGS CT CHEST FINDINGS Cardiovascular: No significant vascular findings. Normal heart size. No pericardial effusion. Mediastinum/Nodes: No pathologically enlarged lymph nodes. There several small lymph nodes within LEFT and RIGHT axilla which have normal fatty hila. No supraclavicular adenopathy. No mediastinal hilar adenopathy.  No pericardial effusion. Esophagus trachea normal. Lungs/Pleura: No suspicious pulmonary nodules. Normal pleural. Airways normal. Musculoskeletal: No aggressive osseous lesion. CT ABDOMEN AND PELVIS FINDINGS Hepatobiliary: No focal hepatic lesion. No biliary ductal dilatation. Gallbladder is normal. Common bile duct is normal. Pancreas: Pancreas is normal. No ductal dilatation. No pancreatic inflammation. Spleen: Normal volume spleen. Adrenals/urinary tract: Adrenal glands and kidneys are normal. The ureters and bladder normal.  Stomach/Bowel: Stomach, duodenum small bowel are normal. Terminal ileum and cecum normal. The colon and rectosigmoid colon are normal. Vascular/Lymphatic: Abdominal aorta is normal caliber. There is no retroperitoneal or periportal lymphadenopathy. No pelvic lymphadenopathy. No inguinal adenopathy. Reproductive: Uterus and adnexa unremarkable. Other: No free fluid. Musculoskeletal: No aggressive osseous lesion. IMPRESSION: Chest Impression: 1. No lymphadenopathy in the axilla or  mediastinum. 2. No pulmonary nodules. Abdomen / Pelvis Impression: 1. No retroperitoneal, pelvic or inguinal adenopathy. 2. Normal volume spleen. Electronically Signed   By: Suzy Bouchard M.D.   On: 03/20/2022 15:47     ASSESSMENT:  1.  Thrombocytosis: -Testing was negative for Jak 2 V6 53F mutation and BCR/ABL. -She does have a long history of thrombocytosis with platelet count mostly between 500s and 600s. -She has never had a history of thrombosis.  No connective tissue disorder.  Physical exam does not reveal any splenomegaly. -JAK2 exon 12 through 15 were negative.  PML and CAL R mutations were not assessed.     2.  Eosinophilia: -Patient reports she has eczema with frequent flareups.       3.  Macrocytosis: -She has an MCV of 101.7 with normal hemoglobin. -Previous testing for vitamin H18 and folic acid were negative. -TSH was also normal in November 2019.   4.  Tobacco abuse: -Patient is a current every day smoker with half a pack of cigarettes a day for the past 33 years.   PLAN:  1.    JAK2 V653F negative thrombocytosis: - No aquagenic pruritus or vasomotor symptoms.  No prior history of thrombosis. - She has eczema predominantly on the back, and uses Kenalog cream once a day. - Previous testing for CALR and MPL mutations were negative. - Last CBC on 10/30/2021 showed platelet count 543 and stable. - CT CAP on 03/19/2022 did not show splenomegaly. - We discussed next best step with bone marrow biopsy.  She is not interested in pursuing that option at this time.  We will rediscuss this if her counts get worse.  RTC 6 months for follow-up with repeat labs.   2.  Eosinophilia: - She has elevated absolute eosinophil count for the last few years. - CBC on 10/30/2021 with AEC of 1.7. - Reviewed CT CAP from 03/19/2022 which showed small benign lymph nodes in the bilateral axillary regions.  No pathological lymphadenopathy.  No lung nodules.  No splenomegaly. - She does not have any  B symptoms.  No signs of endorgan involvement on imaging. - We discussed role of bone marrow biopsy.  She is not interested in pursuing that option at this time.  Reevaluate in 6 months.  Orders placed this encounter:  Orders Placed This Encounter  Procedures   CBC with Differential/Platelet   Comprehensive metabolic panel   Lactate dehydrogenase      Derek Jack, MD [place] [phone]

## 2022-03-26 NOTE — Patient Instructions (Signed)
[facility]  Discharge Instructions  You were seen and examined today by Dr. Delton Coombes.  Dr. Delton Coombes discussed your most recent CT scan which revealed that everything looks good.  Follow-up as scheduled in 6 month with labs.    Thank you for choosing [facility] to provide your oncology and hematology care.   To afford each patient quality time with our provider, please arrive at least 15 minutes before your scheduled appointment time. You may need to reschedule your appointment if you arrive late (10 or more minutes). Arriving late affects you and other patients whose appointments are after yours.  Also, if you miss three or more appointments without notifying the office, you may be dismissed from the clinic at the provider's discretion.    Again, thank you for choosing [facility].  Our hope is that these requests will decrease the amount of time that you wait before being seen by our physicians.   If you have a lab appointment with the [facility] please come in thru the Main Entrance and check in at the main information desk.           _____________________________________________________________  Should you have questions after your visit to [facility], please contact our office at [phone] and follow the prompts.  Our office hours are 8:00 a.m. to 4:30 p.m. Monday - Thursday and 8:00 a.m. to 2:30 p.m. Friday.  Please note that voicemails left after 4:00 p.m. may not be returned until the following business day.  We are closed weekends and all major holidays.  You do have access to a nurse 24-7, just call the main number to the clinic [phone] and do not press any options, hold on the line and a nurse will answer the phone.    For prescription refill requests, have your pharmacy contact our office and allow 72 hours.    Masks are optional in the cancer centers. If you would like for your  care team to wear a mask while they are taking care of you, please let them know. You may have one support person who is at least 55 years old accompany you for your appointments.

## 2022-04-16 DIAGNOSIS — L508 Other urticaria: Secondary | ICD-10-CM | POA: Diagnosis not present

## 2022-04-16 DIAGNOSIS — Z1283 Encounter for screening for malignant neoplasm of skin: Secondary | ICD-10-CM | POA: Diagnosis not present

## 2022-04-16 DIAGNOSIS — L309 Dermatitis, unspecified: Secondary | ICD-10-CM | POA: Diagnosis not present

## 2022-04-16 DIAGNOSIS — D225 Melanocytic nevi of trunk: Secondary | ICD-10-CM | POA: Diagnosis not present

## 2022-05-21 DIAGNOSIS — L508 Other urticaria: Secondary | ICD-10-CM | POA: Diagnosis not present

## 2022-06-01 ENCOUNTER — Other Ambulatory Visit: Payer: Self-pay | Admitting: Obstetrics & Gynecology

## 2022-06-01 DIAGNOSIS — Z1231 Encounter for screening mammogram for malignant neoplasm of breast: Secondary | ICD-10-CM

## 2022-07-25 DIAGNOSIS — L501 Idiopathic urticaria: Secondary | ICD-10-CM | POA: Diagnosis not present

## 2022-07-26 ENCOUNTER — Ambulatory Visit
Admission: RE | Admit: 2022-07-26 | Discharge: 2022-07-26 | Disposition: A | Discharge status: Home / Self Care | Payer: Federal, State, Local not specified - PPO | Source: Ambulatory Visit | Attending: Obstetrics & Gynecology | Admitting: Obstetrics & Gynecology

## 2022-07-26 DIAGNOSIS — Z1231 Encounter for screening mammogram for malignant neoplasm of breast: Secondary | ICD-10-CM | POA: Diagnosis not present

## 2022-08-01 DIAGNOSIS — L308 Other specified dermatitis: Secondary | ICD-10-CM | POA: Diagnosis not present

## 2022-08-29 DIAGNOSIS — L408 Other psoriasis: Secondary | ICD-10-CM | POA: Diagnosis not present

## 2022-08-29 DIAGNOSIS — X32XXXD Exposure to sunlight, subsequent encounter: Secondary | ICD-10-CM | POA: Diagnosis not present

## 2022-08-29 DIAGNOSIS — L218 Other seborrheic dermatitis: Secondary | ICD-10-CM | POA: Diagnosis not present

## 2022-08-29 DIAGNOSIS — L570 Actinic keratosis: Secondary | ICD-10-CM | POA: Diagnosis not present

## 2022-09-12 DIAGNOSIS — X32XXXD Exposure to sunlight, subsequent encounter: Secondary | ICD-10-CM | POA: Diagnosis not present

## 2022-09-12 DIAGNOSIS — L570 Actinic keratosis: Secondary | ICD-10-CM | POA: Diagnosis not present

## 2022-09-17 ENCOUNTER — Other Ambulatory Visit: Payer: Federal, State, Local not specified - PPO

## 2022-09-24 ENCOUNTER — Ambulatory Visit: Payer: Federal, State, Local not specified - PPO | Admitting: Physician Assistant

## 2022-10-29 ENCOUNTER — Inpatient Hospital Stay: Payer: Federal, State, Local not specified - PPO | Attending: Hematology

## 2022-10-29 DIAGNOSIS — D721 Eosinophilia, unspecified: Secondary | ICD-10-CM | POA: Insufficient documentation

## 2022-10-29 DIAGNOSIS — D75839 Thrombocytosis, unspecified: Secondary | ICD-10-CM

## 2022-10-29 DIAGNOSIS — Z79899 Other long term (current) drug therapy: Secondary | ICD-10-CM | POA: Diagnosis not present

## 2022-10-29 LAB — CBC WITH DIFFERENTIAL/PLATELET
Abs Immature Granulocytes: 0.03 10*3/uL (ref 0.00–0.07)
Basophils Absolute: 0.1 10*3/uL (ref 0.0–0.1)
Basophils Relative: 1 %
Eosinophils Absolute: 2.7 10*3/uL — ABNORMAL HIGH (ref 0.0–0.5)
Eosinophils Relative: 25 %
HCT: 37.8 % (ref 36.0–46.0)
Hemoglobin: 13 g/dL (ref 12.0–15.0)
Immature Granulocytes: 0 %
Lymphocytes Relative: 28 %
Lymphs Abs: 3 10*3/uL (ref 0.7–4.0)
MCH: 33.8 pg (ref 26.0–34.0)
MCHC: 34.4 g/dL (ref 30.0–36.0)
MCV: 98.2 fL (ref 80.0–100.0)
Monocytes Absolute: 0.8 10*3/uL (ref 0.1–1.0)
Monocytes Relative: 7 %
Neutro Abs: 4.4 10*3/uL (ref 1.7–7.7)
Neutrophils Relative %: 39 %
Platelets: 535 10*3/uL — ABNORMAL HIGH (ref 150–400)
RBC: 3.85 MIL/uL — ABNORMAL LOW (ref 3.87–5.11)
RDW: 12.5 % (ref 11.5–15.5)
WBC: 11.1 10*3/uL — ABNORMAL HIGH (ref 4.0–10.5)
nRBC: 0 % (ref 0.0–0.2)

## 2022-10-29 LAB — COMPREHENSIVE METABOLIC PANEL
ALT: 21 U/L (ref 0–44)
AST: 24 U/L (ref 15–41)
Albumin: 4.3 g/dL (ref 3.5–5.0)
Alkaline Phosphatase: 60 U/L (ref 38–126)
Anion gap: 10 (ref 5–15)
BUN: 10 mg/dL (ref 6–20)
CO2: 24 mmol/L (ref 22–32)
Calcium: 9.1 mg/dL (ref 8.9–10.3)
Chloride: 98 mmol/L (ref 98–111)
Creatinine, Ser: 0.78 mg/dL (ref 0.44–1.00)
GFR, Estimated: >60 mL/min (ref >60)
Glucose, Bld: 102 mg/dL — ABNORMAL HIGH (ref 70–99)
Potassium: 3.8 mmol/L (ref 3.5–5.1)
Sodium: 132 mmol/L — ABNORMAL LOW (ref 135–145)
Total Bilirubin: 0.5 mg/dL (ref 0.3–1.2)
Total Protein: 6.9 g/dL (ref 6.5–8.1)

## 2022-10-29 LAB — LACTATE DEHYDROGENASE: LDH: 186 U/L (ref 98–192)

## 2022-11-02 NOTE — Progress Notes (Unsigned)
[facility] [address], [place] [address]   CLINIC:  Medical Oncology/Hematology  PCP:  Patient, No Pcp Per No address on file None   REASON FOR VISIT:  Follow-up for thrombocytosis and eosinophilia  PRIOR THERAPY: None  CURRENT THERAPY: Surveillance  INTERVAL HISTORY:   Sherri Jackson 57 y.o. female returns for routine follow-up of thrombocytosis and eosinophilia.  She was last seen by Dr. Ellin Saba on 03/26/2022.  At today's visit, she reports feeling fairly well.    Since her last visit, she reports that she was diagnosed with "chronic hives."  She had been dealing with chronic rash and pruritus for several years, which was being treated as eczema.  Her dermatologist performed a skin biopsy, which was consistent with hives.  She was seen by allergist (Dr. [place] Callas) on 07/25/2022, who told her that she had "chronic urticaria" of unknown cause.  She continues to use Kenalog cream once or twice daily.  She does also have seasonal allergies and rhinorrhea.  CT scan shows evidence of early emphysema, but she denies any history of asthma.  She denies any vasomotor symptoms or history of prior thrombosis.  She continues to smoke daily, but has cut down to about 0.25 PPD cigarettes.  She has 100% energy and 100% appetite. She endorses that she is maintaining a stable weight.   ASSESSMENT & PLAN:  1.   Thrombocytosis: - Longstanding history of thrombocytosis with platelet count mostly between 500s and 600s, present since at least 2015.   - Mutational testing NEGATIVE for JAK2 V617F, CALR, MPL, Exons 12-15, and BCR-ABL  - No history of DVT, PE, MI, CVA.   No connective tissue disorder.  Rheumatoid factor negative; previous ESR and CRP were normal. - No aquagenic pruritus or vasomotor symptoms.  No prior history of thrombosis. - She has eczema predominantly on the back, and uses Kenalog cream once a day. - Physical exam does not reveal any palpable lymphadenopathy  or splenomegaly. - Most recent labs (10/29/2022): Platelets 535.  LDH is normal. - DIFFERENTIAL DIAGNOSIS includes reactive thrombocytosis and leukocytosis from cigarette smoking and steroid use.  She may also have some underlying clonal disorder of the bone marrow. - We discussed next best step with bone marrow biopsy.  She is not interested in pursuing that option at this time.  We will rediscuss this if her counts get worse.  RTC 6 months for follow-up with repeat labs. - PLAN:  We will check ANA to complete autoimmune/inflammatory workup.  2.  Eosinophilia - She has had intermittent leukocytosis since 2017. - Persistent elevations in eosinophils since at least 2019 (no prior differentials available).  Neutrophils, lymphocytes, and monocytes have been normal. -No signs of endorgan involvement on imaging.  (Reviewed CT CAP from 03/19/2022 which showed small benign lymph nodes in the bilateral axillary regions.  No pathological lymphadenopathy.  No lung nodules.  No splenomegaly.) -Chronic rash and pruritus for several years, which was being treated as eczema.  Dermatologist performed a skin biopsy, which was consistent with hives.  Seen by allergist (Dr. [place] Callas) on 07/25/2022, who told her that she had "chronic urticaria" of unknown cause.  She continues to use Kenalog cream once or twice daily.  She does also have seasonal allergies and rhinorrhea.  No history of asthma. - She does not have any B symptoms or vasomotor symptoms. - Most recent CBC/differential (10/29/2022) with WBC 11.1 and eosinophils 2.7.  LDH is normal. - Discussed with patient that her eosinophilia could  be the result of her chronic urticaria, or could be the underlying cause of her chronic urticaria.  Discussed importance of additional testing to determine root cause.  She would prefer to start with laboratory testing and save bone marrow biopsy for "last step and only if it is completely necessary" - PLAN: Will check additional labs  with IgE, flow cytometry, troponin, B12, tryptase, Strongyloides, EBV DNA, MPN/hypereosinophilia panel ([facility] send out test [phone]) - Request records from skin biopsy (Dr. Margo Aye) and office note from allergist (Dr. [place] Callas)  3.  Macrocytosis -She has previously had some mild macrocytosis with MCV up to 101.7 - Most recent CBC (10/29/2022) shows normal MCV 98.2 - Previous testing for B12, folic acid, and TSH were normal  4.  Tobacco abuse - Current everyday smoker (0.5 PPD cigarettes x 33 years) - trying to cut back and eventually quit.  She is down to 5 cigarettes daily.   PLAN SUMMARY: >> Labs at some point this week or next = IgE, flow cytometry, troponin, Vitamin B12, tryptase, Strongyloides, EBV DNA, ANA, ESR, CRP, MPN/hypereosinophilia panel ([facility] send out test [id number]) >> Request records from: Allergist (Dr. Sidney Ace at [place]) - office visit on 07/25/2022 Dermatologist (Dr. Margo Aye / Dr. Charlton Haws) - skin biopsy results from December/January >> OFFICE visit on 6/26/204     REVIEW OF SYSTEMS:   Review of Systems  Constitutional:  Negative for appetite change, chills, diaphoresis, fatigue, fever and unexpected weight change.  HENT:   Negative for lump/mass and nosebleeds.   Eyes:  Negative for eye problems.  Respiratory:  Negative for cough, hemoptysis and shortness of breath.   Cardiovascular:  Negative for chest pain, leg swelling and palpitations.  Gastrointestinal:  Negative for abdominal pain, blood in stool, constipation, diarrhea, nausea and vomiting.  Genitourinary:  Negative for hematuria.   Skin:  Positive for itching and rash.  Neurological:  Negative for dizziness, headaches and light-headedness.  Hematological:  Does not bruise/bleed easily.     PHYSICAL EXAM:  ECOG PERFORMANCE STATUS: 1 - Symptomatic but completely ambulatory  There were no vitals filed for this visit. There were no vitals filed for this visit. Physical Exam Constitutional:       Appearance: Normal appearance. She is underweight.  Cardiovascular:     Heart sounds: Normal heart sounds.  Pulmonary:     Breath sounds: Normal breath sounds.  Neurological:     General: No focal deficit present.     Mental Status: Mental status is at baseline.  Psychiatric:        Behavior: Behavior normal. Behavior is cooperative.     PAST MEDICAL/SURGICAL HISTORY:  Past Medical History:  Diagnosis Date   Elevated MCV 03/20/2016   Will check B12 and folate   Enlarged thyroid 12/01/2013   Peri-menopausal 12/01/2013   Vaginal Pap smear, abnormal    Vitamin D deficiency 03/20/2016   Past Surgical History:  Procedure Laterality Date   ANKLE SURGERY Left    APPENDECTOMY     HYSTEROSCOPY W/ ENDOMETRIAL ABLATION  2004   laser conization of cervix  2004    SOCIAL HISTORY:  Social History   Socioeconomic History   Marital status: Married    Spouse name: Not on file   Number of children: Not on file   Years of education: Not on file   Highest education level: Not on file  Occupational History   Not on file  Tobacco Use   Smoking status: Every Day    Packs/day: 0.50  Years: 33.00    Additional pack years: 0.00    Total pack years: 16.50    Types: Cigarettes   Smokeless tobacco: Never  Vaping Use   Vaping Use: Never used  Substance and Sexual Activity   Alcohol use: Yes    Comment: occ   Drug use: No   Sexual activity: Yes    Birth control/protection: Post-menopausal  Other Topics Concern   Not on file  Social History Narrative   Not on file   Social Determinants of Health   Financial Resource Strain: Low Risk  (02/28/2022)   Overall Financial Resource Strain (CARDIA)    Difficulty of Paying Living Expenses: Not hard at all  Food Insecurity: No Food Insecurity (02/28/2022)   Hunger Vital Sign    Worried About Running Out of Food in the Last Year: Never true    Ran Out of Food in the Last Year: Never true  Transportation Needs: No Transportation Needs  (02/28/2022)   PRAPARE - Administrator, Civil Service (Medical): No    Lack of Transportation (Non-Medical): No  Physical Activity: Sufficiently Active (02/28/2022)   Exercise Vital Sign    Days of Exercise per Week: 6 days    Minutes of Exercise per Session: 40 min  Stress: Stress Concern Present (02/28/2022)   [employer] of Occupational Health - Occupational Stress Questionnaire    Feeling of Stress : To some extent  Social Connections: Unknown (02/28/2022)   Social Connection and Isolation Panel [NHANES]    Frequency of Communication with Friends and Family: Twice a week    Frequency of Social Gatherings with Friends and Family: Once a week    Attends Religious Services: Patient declined    Database administrator or Organizations: Patient declined    Attends Banker Meetings: Patient declined    Marital Status: Married  Catering manager Violence: Not At Risk (02/28/2022)   Humiliation, Afraid, Rape, and Kick questionnaire    Fear of Current or Ex-Partner: No    Emotionally Abused: No    Physically Abused: No    Sexually Abused: No    FAMILY HISTORY:  Family History  Problem Relation Age of Onset   Cancer Father        lung   COPD Father    Emphysema Father    Hyperlipidemia Father    Hypertension Father    Thyroid disease Father    Heart disease Paternal Grandmother    Heart attack Paternal Grandfather    Diabetes Paternal Uncle    Breast cancer Cousin    Breast cancer Cousin     CURRENT MEDICATIONS:  Outpatient Encounter Medications as of 11/05/2022  Medication Sig   Acetaminophen (TYLENOL PO) Take by mouth.   Biotin 5 MG TABS    cetirizine (ZYRTEC) 10 MG tablet Take 10 mg by mouth daily.   ibuprofen (ADVIL,MOTRIN) 200 MG tablet Take 400 mg by mouth as needed.   LORazepam (ATIVAN) 0.5 MG tablet Take 1 every 12 hours as needed   Multiple Vitamin (MULTIVITAMIN) tablet Take 1 tablet by mouth daily.   triamcinolone cream (KENALOG) 0.1 %  Apply 1 application topically 2 (two) times daily.   vitamin B-12 (CYANOCOBALAMIN) 100 MCG tablet    VITAMIN D, ERGOCALCIFEROL, PO    No facility-administered encounter medications on file as of 11/05/2022.    ALLERGIES:  No Known Allergies  LABORATORY DATA:  I have reviewed the labs as listed.  CBC    Component Value Date/Time  WBC 11.1 (H) 10/29/2022 1455   RBC 3.85 (L) 10/29/2022 1455   HGB 13.0 10/29/2022 1455   HGB 13.5 12/19/2020 1639   HCT 37.8 10/29/2022 1455   HCT 41.2 12/19/2020 1639   PLT 535 (H) 10/29/2022 1455   PLT 583 (H) 12/19/2020 1639   MCV 98.2 10/29/2022 1455   MCV 101 (H) 12/19/2020 1639   MCH 33.8 10/29/2022 1455   MCHC 34.4 10/29/2022 1455   RDW 12.5 10/29/2022 1455   RDW 12.0 12/19/2020 1639   LYMPHSABS 3.0 10/29/2022 1455   MONOABS 0.8 10/29/2022 1455   EOSABS 2.7 (H) 10/29/2022 1455   BASOSABS 0.1 10/29/2022 1455      Latest Ref Rng & Units 10/29/2022    2:55 PM 02/28/2022    8:11 AM 03/08/2021    4:01 PM  CMP  Glucose 70 - 99 mg/dL 161  90  95   BUN 6 - 20 mg/dL 10  7  7    Creatinine 0.44 - 1.00 mg/dL 0.96  0.45  4.09   Sodium 135 - 145 mmol/L 132  138  134   Potassium 3.5 - 5.1 mmol/L 3.8  4.7  3.7   Chloride 98 - 111 mmol/L 98  98  101   CO2 22 - 32 mmol/L 24  22  24    Calcium 8.9 - 10.3 mg/dL 9.1  81.1  9.1   Total Protein 6.5 - 8.1 g/dL 6.9  7.1  7.1   Total Bilirubin 0.3 - 1.2 mg/dL 0.5  0.3  0.2   Alkaline Phos 38 - 126 U/L 60  71  64   AST 15 - 41 U/L 24  25  19    ALT 0 - 44 U/L 21  18  16      DIAGNOSTIC IMAGING:  I have independently reviewed the relevant imaging and discussed with the patient.   WRAP UP:  All questions were answered. The patient knows to call the clinic with any problems, questions or concerns.  Medical decision making: Moderate  Time spent on visit: I spent 20 minutes counseling the patient face to face. The total time spent in the appointment was 30 minutes and more than 50% was on counseling.  Carnella Guadalajara, PA-C  11/05/22 6:32 PM

## 2022-11-05 ENCOUNTER — Encounter: Payer: Self-pay | Admitting: Physician Assistant

## 2022-11-05 ENCOUNTER — Inpatient Hospital Stay: Payer: Federal, State, Local not specified - PPO | Attending: Physician Assistant | Admitting: Physician Assistant

## 2022-11-05 VITALS — BP 144/90 | HR 102 | Temp 98.0°F | Resp 18 | Ht 59.0 in | Wt 89.1 lb

## 2022-11-05 DIAGNOSIS — Z833 Family history of diabetes mellitus: Secondary | ICD-10-CM | POA: Insufficient documentation

## 2022-11-05 DIAGNOSIS — L299 Pruritus, unspecified: Secondary | ICD-10-CM | POA: Diagnosis not present

## 2022-11-05 DIAGNOSIS — Z803 Family history of malignant neoplasm of breast: Secondary | ICD-10-CM | POA: Diagnosis not present

## 2022-11-05 DIAGNOSIS — Z825 Family history of asthma and other chronic lower respiratory diseases: Secondary | ICD-10-CM | POA: Insufficient documentation

## 2022-11-05 DIAGNOSIS — Z9049 Acquired absence of other specified parts of digestive tract: Secondary | ICD-10-CM | POA: Insufficient documentation

## 2022-11-05 DIAGNOSIS — J3489 Other specified disorders of nose and nasal sinuses: Secondary | ICD-10-CM | POA: Insufficient documentation

## 2022-11-05 DIAGNOSIS — D721 Eosinophilia, unspecified: Secondary | ICD-10-CM

## 2022-11-05 DIAGNOSIS — F17210 Nicotine dependence, cigarettes, uncomplicated: Secondary | ICD-10-CM | POA: Diagnosis not present

## 2022-11-05 DIAGNOSIS — Z8349 Family history of other endocrine, nutritional and metabolic diseases: Secondary | ICD-10-CM | POA: Diagnosis not present

## 2022-11-05 DIAGNOSIS — Z801 Family history of malignant neoplasm of trachea, bronchus and lung: Secondary | ICD-10-CM | POA: Insufficient documentation

## 2022-11-05 DIAGNOSIS — D75839 Thrombocytosis, unspecified: Secondary | ICD-10-CM | POA: Insufficient documentation

## 2022-11-05 DIAGNOSIS — Z8249 Family history of ischemic heart disease and other diseases of the circulatory system: Secondary | ICD-10-CM | POA: Diagnosis not present

## 2022-11-05 DIAGNOSIS — D7589 Other specified diseases of blood and blood-forming organs: Secondary | ICD-10-CM | POA: Diagnosis not present

## 2022-11-05 DIAGNOSIS — L309 Dermatitis, unspecified: Secondary | ICD-10-CM | POA: Diagnosis not present

## 2022-11-05 DIAGNOSIS — Z79899 Other long term (current) drug therapy: Secondary | ICD-10-CM | POA: Insufficient documentation

## 2022-11-05 DIAGNOSIS — J302 Other seasonal allergic rhinitis: Secondary | ICD-10-CM | POA: Insufficient documentation

## 2022-11-05 NOTE — Patient Instructions (Signed)
[facility] at [facility] **VISIT SUMMARY & IMPORTANT INSTRUCTIONS **   You were seen today by Rojelio Brenner PA-C for your elevated platelets and elevated eosinophils.    ELEVATED EOSINOPHILS Your elevated eosinophils could certainly be the result of inflammation from your chronic hives.  However, they could also be the cause of your chronic hives. I recommend additional lab testing, and we will consider bone marrow biopsy in the future. We will also obtain records of your dermatology biopsy.  ELEVATED PLATELETS Your platelets are most likely reactive from inflammation and smoking.  FOLLOW-UP APPOINTMENT: Office visit in 6 to 8 weeks to discuss lab results and next steps.  ** Thank you for trusting me with your healthcare!  I strive to provide all of my patients with quality care at each visit.  If you receive a survey for this visit, I would be so grateful to you for taking the time to provide feedback.  Thank you in advance!  ~ Eleno Weimar                   Dr. Doreatha Massed   &   Rojelio Brenner, PA-C   - - - - - - - - - - - - - - - - - -    Thank you for choosing [facility] at [facility] to provide your oncology and hematology care.  To afford each patient quality time with our provider, please arrive at least 15 minutes before your scheduled appointment time.   If you have a lab appointment with the Cancer Center please come in thru the Main Entrance and check in at the main information desk.  You need to re-schedule your appointment should you arrive 10 or more minutes late.  We strive to give you quality time with our providers, and arriving late affects you and other patients whose appointments are after yours.  Also, if you no show three or more times for appointments you may be dismissed from the clinic at the providers discretion.     Again, thank you for choosing [facility].  Our hope is that these requests  will decrease the amount of time that you wait before being seen by our physicians.       _____________________________________________________________  Should you have questions after your visit to [facility], please contact our office at [phone] and follow the prompts.  Our office hours are 8:00 a.m. and 4:30 p.m. Monday - Friday.  Please note that voicemails left after 4:00 p.m. may not be returned until the following business day.  We are closed weekends and major holidays.  You do have access to a nurse 24-7, just call the main number to the clinic [phone] and do not press any options, hold on the line and a nurse will answer the phone.    For prescription refill requests, have your pharmacy contact our office and allow 72 hours.

## 2022-11-06 ENCOUNTER — Other Ambulatory Visit: Payer: Self-pay

## 2022-11-06 DIAGNOSIS — D721 Eosinophilia, unspecified: Secondary | ICD-10-CM

## 2022-11-06 DIAGNOSIS — D75839 Thrombocytosis, unspecified: Secondary | ICD-10-CM

## 2022-11-06 DIAGNOSIS — I889 Nonspecific lymphadenitis, unspecified: Secondary | ICD-10-CM

## 2022-11-12 ENCOUNTER — Inpatient Hospital Stay: Payer: Federal, State, Local not specified - PPO

## 2022-11-12 DIAGNOSIS — F17210 Nicotine dependence, cigarettes, uncomplicated: Secondary | ICD-10-CM | POA: Diagnosis not present

## 2022-11-12 DIAGNOSIS — J3489 Other specified disorders of nose and nasal sinuses: Secondary | ICD-10-CM | POA: Diagnosis not present

## 2022-11-12 DIAGNOSIS — Z8349 Family history of other endocrine, nutritional and metabolic diseases: Secondary | ICD-10-CM | POA: Diagnosis not present

## 2022-11-12 DIAGNOSIS — I889 Nonspecific lymphadenitis, unspecified: Secondary | ICD-10-CM | POA: Diagnosis not present

## 2022-11-12 DIAGNOSIS — Z825 Family history of asthma and other chronic lower respiratory diseases: Secondary | ICD-10-CM | POA: Diagnosis not present

## 2022-11-12 DIAGNOSIS — D75839 Thrombocytosis, unspecified: Secondary | ICD-10-CM | POA: Diagnosis not present

## 2022-11-12 DIAGNOSIS — D721 Eosinophilia, unspecified: Secondary | ICD-10-CM

## 2022-11-12 DIAGNOSIS — L299 Pruritus, unspecified: Secondary | ICD-10-CM | POA: Diagnosis not present

## 2022-11-12 DIAGNOSIS — J302 Other seasonal allergic rhinitis: Secondary | ICD-10-CM | POA: Diagnosis not present

## 2022-11-12 DIAGNOSIS — Z8249 Family history of ischemic heart disease and other diseases of the circulatory system: Secondary | ICD-10-CM | POA: Diagnosis not present

## 2022-11-12 DIAGNOSIS — D7589 Other specified diseases of blood and blood-forming organs: Secondary | ICD-10-CM | POA: Diagnosis not present

## 2022-11-12 DIAGNOSIS — L309 Dermatitis, unspecified: Secondary | ICD-10-CM | POA: Diagnosis not present

## 2022-11-12 DIAGNOSIS — Z79899 Other long term (current) drug therapy: Secondary | ICD-10-CM | POA: Diagnosis not present

## 2022-11-12 DIAGNOSIS — Z9049 Acquired absence of other specified parts of digestive tract: Secondary | ICD-10-CM | POA: Diagnosis not present

## 2022-11-12 DIAGNOSIS — Z801 Family history of malignant neoplasm of trachea, bronchus and lung: Secondary | ICD-10-CM | POA: Diagnosis not present

## 2022-11-12 LAB — VITAMIN B12: Vitamin B-12: 1077 pg/mL — ABNORMAL HIGH (ref 180–914)

## 2022-11-12 LAB — C-REACTIVE PROTEIN: CRP: 0.6 mg/dL (ref <1.0)

## 2022-11-12 LAB — TROPONIN I (HIGH SENSITIVITY): Troponin I (High Sensitivity): 13 ng/L (ref <18)

## 2022-11-12 LAB — SEDIMENTATION RATE: Sed Rate: 8 mm/hr (ref 0–22)

## 2022-11-12 NOTE — Addendum Note (Signed)
Addended by: Geradine Girt B on: 11/12/2022 02:01 PM   Modules accepted: Orders

## 2022-11-13 LAB — SURGICAL PATHOLOGY

## 2022-11-14 LAB — EPSTEIN BARR VRS(EBV DNA BY PCR): EBV DNA QN by PCR: NEGATIVE IU/mL

## 2022-11-14 LAB — IGE: IgE (Immunoglobulin E), Serum: 169 IU/mL (ref 6–495)

## 2022-11-14 LAB — TRYPTASE: Tryptase: 5.1 ug/L (ref 2.2–13.2)

## 2022-11-15 LAB — ANTINUCLEAR ANTIBODIES, IFA: ANA Ab, IFA: NEGATIVE

## 2022-11-16 LAB — MPN W/HYPEREOSINOPHILIA FISH

## 2022-11-21 LAB — FLOW CYTOMETRY

## 2022-12-25 NOTE — Progress Notes (Unsigned)
[facility] [address], [place] [address]   CLINIC:  Medical Oncology/Hematology  PCP:  Patient, No Pcp Per No address on file None   REASON FOR VISIT:  Follow-up for thrombocytosis and eosinophilia  PRIOR THERAPY: None  CURRENT THERAPY: Surveillance  INTERVAL HISTORY:   [name] 57 y.o. female returns for routine follow-up of thrombocytosis and eosinophilia.  She was last seen by Rojelio Brenner PA-C on 11/05/2022  At today's visit, she reports feeling fairly well.  She continues to deal with chronic urticaria, but this has been slightly improved over the past month.  She does also have seasonal allergies and rhinorrhea.  She denies any constitutional symptoms such as fever, chills, night sweats, unintentional weight loss.  She denies any vasomotor symptoms or history of prior thrombosis.  She continues to smoke daily, but has cut down to about 0.25 PPD cigarettes.  She has 100% energy and 100% appetite. She endorses that she is maintaining a stable weight.   ASSESSMENT & PLAN:  1.   Thrombocytosis: - Longstanding history of thrombocytosis with platelet count mostly between 500s and 600s, present since at least 2015.   - Mutational testing NEGATIVE for JAK2 V617F, CALR, MPL, Exons 12-15, and BCR-ABL  - No history of DVT, PE, MI, CVA.   No connective tissue disorder.  Rheumatoid factor and ANA negative.  ESR and CRP were normal. - No aquagenic pruritus or vasomotor symptoms.  No prior history of thrombosis. - She has been diagnosed with idiopathic urticaria and chronic allergies.   - Physical exam does not reveal any palpable lymphadenopathy or splenomegaly. - Most recent CBC/D (10/29/2022): Platelets 535.  LDH is normal. - DIFFERENTIAL DIAGNOSIS includes reactive thrombocytosis and leukocytosis from cigarette smoking and steroid use.  She may also have some underlying clonal disorder of the bone marrow. - PLAN: Recommend bone marrow biopsy.   Patient is agreeable.  2.  Eosinophilia - She has had intermittent leukocytosis since 2017. - Persistent elevations in eosinophils since at least 2019 (no prior differentials available).  Neutrophils, lymphocytes, and monocytes have been normal. - No signs of end organ involvement on imaging.  (Reviewed CT CAP from 03/19/2022 which showed small benign lymph nodes in the bilateral axillary regions.  No pathological lymphadenopathy.  No lung nodules.  No splenomegaly.) - Follows with [facility] (Dr. Kerr Callas) for idiopathic urticaria, allergic rhinitis, and atopic dermatitis. - No history of asthma, but imaging findings consistent with COPD. - Skin biopsy (04/16/2022) showed "perivascular dermatitis with eosinophils" (moderately dense inflammatory infiltrate in the dermis primarily in the perivascular distribution; infiltrate contains lymphocytes, eosinophils and occasional histiocytes; differential diagnosis from histological standpoint includes florid urticaria, papular urticaria, drug reaction, insect bite reaction, or other arthropod bites." - She uses Kenalog cream for rash. - She does not have any B symptoms or vasomotor symptoms. - Eosinophilia workup (11/12/2022):  No mutations of CHIC2, PDGFRA, PDGFRB, or FGFR1 observed. Flow cytometry shows predominance of T cells with nonspecific changes, but no monoclonal B-cell population identified Mild elevation of vitamin B12 at 1077 (denies any current B12 supplementation) Normal IgE levels, troponin, tryptase, ESR, CRP, EBV DNA, ANA. - Most recent CBC/differential (10/29/2022) with WBC 11.1 and eosinophils 2.7.  LDH is normal. - Discussed with patient that her eosinophilia could be the result of her chronic urticaria, or could be the underlying cause of her chronic urticaria.  Discussed importance of additional testing to determine root cause.  She would prefer to start with laboratory testing  and save bone marrow biopsy for "last step and only if it  is completely necessary" - PLAN: Recommend bone marrow biopsy.  Patient is agreeable.  3.  Macrocytosis - She has previously had some mild macrocytosis with MCV up to 101.7 - Most recent CBC (10/29/2022) shows normal MCV 98.2 - Previous testing for B12, folic acid, and TSH were normal  4.  Tobacco abuse - Current everyday smoker (0.5 PPD cigarettes x 33 years) - trying to cut back and eventually quit.  She is down to 5 cigarettes daily.   PLAN SUMMARY: >> CT-guided bone marrow biopsy at [facility] >> OFFICE visit 1 week after biopsy     REVIEW OF SYSTEMS:  Review of Systems  Constitutional:  Negative for appetite change, chills, diaphoresis, fatigue, fever and unexpected weight change.  HENT:   Negative for lump/mass and nosebleeds.   Eyes:  Negative for eye problems.  Respiratory:  Negative for cough, hemoptysis and shortness of breath.   Cardiovascular:  Negative for chest pain, leg swelling and palpitations.  Gastrointestinal:  Negative for abdominal pain, blood in stool, constipation, diarrhea, nausea and vomiting.  Genitourinary:  Negative for hematuria.   Skin:  Positive for itching and rash.  Neurological:  Negative for dizziness, headaches and light-headedness.  Hematological:  Does not bruise/bleed easily.     PHYSICAL EXAM:  ECOG PERFORMANCE STATUS: 1 - Symptomatic but completely ambulatory  There were no vitals filed for this visit. There were no vitals filed for this visit. Physical Exam Constitutional:      Appearance: Normal appearance. She is underweight.  Cardiovascular:     Heart sounds: Normal heart sounds.  Pulmonary:     Breath sounds: Normal breath sounds.  Neurological:     General: No focal deficit present.     Mental Status: Mental status is at baseline.  Psychiatric:        Behavior: Behavior normal. Behavior is cooperative.     PAST MEDICAL/SURGICAL HISTORY:  Past Medical History:  Diagnosis Date   Elevated MCV 03/20/2016   Will check  B12 and folate   Enlarged thyroid 12/01/2013   Peri-menopausal 12/01/2013   Vaginal Pap smear, abnormal    Vitamin D deficiency 03/20/2016   Past Surgical History:  Procedure Laterality Date   ANKLE SURGERY Left    APPENDECTOMY     HYSTEROSCOPY W/ ENDOMETRIAL ABLATION  2004   laser conization of cervix  2004    SOCIAL HISTORY:  Social History   Socioeconomic History   Marital status: Married    Spouse name: Not on file   Number of children: Not on file   Years of education: Not on file   Highest education level: Not on file  Occupational History   Not on file  Tobacco Use   Smoking status: Every Day    Packs/day: 0.50    Years: 33.00    Additional pack years: 0.00    Total pack years: 16.50    Types: Cigarettes   Smokeless tobacco: Never  Vaping Use   Vaping Use: Never used  Substance and Sexual Activity   Alcohol use: Yes    Comment: occ   Drug use: No   Sexual activity: Yes    Birth control/protection: Post-menopausal  Other Topics Concern   Not on file  Social History Narrative   Not on file   Social Determinants of Health   Financial Resource Strain: Low Risk  (02/28/2022)   Overall Financial Resource Strain (CARDIA)    Difficulty  of Paying Living Expenses: Not hard at all  Food Insecurity: No Food Insecurity (02/28/2022)   Hunger Vital Sign    Worried About Running Out of Food in the Last Year: Never true    Ran Out of Food in the Last Year: Never true  Transportation Needs: No Transportation Needs (02/28/2022)   PRAPARE - Administrator, Civil Service (Medical): No    Lack of Transportation (Non-Medical): No  Physical Activity: Sufficiently Active (02/28/2022)   Exercise Vital Sign    Days of Exercise per Week: 6 days    Minutes of Exercise per Session: 40 min  Stress: Stress Concern Present (02/28/2022)   [employer] of Occupational Health - Occupational Stress Questionnaire    Feeling of Stress : To some extent  Social Connections:  Unknown (02/28/2022)   Social Connection and Isolation Panel [NHANES]    Frequency of Communication with Friends and Family: Twice a week    Frequency of Social Gatherings with Friends and Family: Once a week    Attends Religious Services: Patient declined    Database administrator or Organizations: Patient declined    Attends Banker Meetings: Patient declined    Marital Status: Married  Catering manager Violence: Not At Risk (02/28/2022)   Humiliation, Afraid, Rape, and Kick questionnaire    Fear of Current or Ex-Partner: No    Emotionally Abused: No    Physically Abused: No    Sexually Abused: No    FAMILY HISTORY:  Family History  Problem Relation Age of Onset   Cancer Father        lung   COPD Father    Emphysema Father    Hyperlipidemia Father    Hypertension Father    Thyroid disease Father    Heart disease Paternal Grandmother    Heart attack Paternal Grandfather    Diabetes Paternal Uncle    Breast cancer Cousin    Breast cancer Cousin     CURRENT MEDICATIONS:  Outpatient Encounter Medications as of 12/26/2022  Medication Sig   Acetaminophen (TYLENOL PO) Take by mouth.   Biotin 5 MG TABS    cetirizine (ZYRTEC) 10 MG tablet Take 10 mg by mouth daily.   clobetasol (TEMOVATE) 0.05 % external solution Apply topically.   famotidine (PEPCID) 40 MG tablet Take 40 mg by mouth 2 (two) times daily.   fluticasone (FLONASE) 50 MCG/ACT nasal spray 1 spray in each nostril Nasally Once a day for 30 days   hydrOXYzine (ATARAX) 25 MG tablet Take 25-50 mg by mouth at bedtime.   ibuprofen (ADVIL,MOTRIN) 200 MG tablet Take 400 mg by mouth as needed.   LORazepam (ATIVAN) 0.5 MG tablet Take 1 every 12 hours as needed   Multiple Vitamin (MULTIVITAMIN) tablet Take 1 tablet by mouth daily.   triamcinolone cream (KENALOG) 0.1 % Apply 1 application topically 2 (two) times daily.   vitamin B-12 (CYANOCOBALAMIN) 100 MCG tablet    VITAMIN D, ERGOCALCIFEROL, PO    No  facility-administered encounter medications on file as of 12/26/2022.    ALLERGIES:  No Known Allergies  LABORATORY DATA:  I have reviewed the labs as listed.  CBC    Component Value Date/Time   WBC 11.1 (H) 10/29/2022 1455   RBC 3.85 (L) 10/29/2022 1455   HGB 13.0 10/29/2022 1455   HGB 13.5 12/19/2020 1639   HCT 37.8 10/29/2022 1455   HCT 41.2 12/19/2020 1639   PLT 535 (H) 10/29/2022 1455   PLT 583 (H)  12/19/2020 1639   MCV 98.2 10/29/2022 1455   MCV 101 (H) 12/19/2020 1639   MCH 33.8 10/29/2022 1455   MCHC 34.4 10/29/2022 1455   RDW 12.5 10/29/2022 1455   RDW 12.0 12/19/2020 1639   LYMPHSABS 3.0 10/29/2022 1455   MONOABS 0.8 10/29/2022 1455   EOSABS 2.7 (H) 10/29/2022 1455   BASOSABS 0.1 10/29/2022 1455      Latest Ref Rng & Units 10/29/2022    2:55 PM 02/28/2022    8:11 AM 03/08/2021    4:01 PM  CMP  Glucose 70 - 99 mg/dL 161  90  95   BUN 6 - 20 mg/dL 10  7  7    Creatinine 0.44 - 1.00 mg/dL 0.96  0.45  4.09   Sodium 135 - 145 mmol/L 132  138  134   Potassium 3.5 - 5.1 mmol/L 3.8  4.7  3.7   Chloride 98 - 111 mmol/L 98  98  101   CO2 22 - 32 mmol/L 24  22  24    Calcium 8.9 - 10.3 mg/dL 9.1  81.1  9.1   Total Protein 6.5 - 8.1 g/dL 6.9  7.1  7.1   Total Bilirubin 0.3 - 1.2 mg/dL 0.5  0.3  0.2   Alkaline Phos 38 - 126 U/L 60  71  64   AST 15 - 41 U/L 24  25  19    ALT 0 - 44 U/L 21  18  16      DIAGNOSTIC IMAGING:  I have independently reviewed the relevant imaging and discussed with the patient.   WRAP UP:  All questions were answered. The patient knows to call the clinic with any problems, questions or concerns.  Medical decision making: Moderate  Time spent on visit: I spent 20 minutes counseling the patient face to face. The total time spent in the appointment was 30 minutes and more than 50% was on counseling.  Carnella Guadalajara, PA-C  12/26/22 9:00 AM

## 2022-12-26 ENCOUNTER — Inpatient Hospital Stay: Payer: Federal, State, Local not specified - PPO | Attending: Physician Assistant | Admitting: Physician Assistant

## 2022-12-26 ENCOUNTER — Encounter: Payer: Self-pay | Admitting: Physician Assistant

## 2022-12-26 VITALS — BP 144/86 | HR 94 | Temp 98.2°F | Resp 18 | Wt 89.0 lb

## 2022-12-26 DIAGNOSIS — J3489 Other specified disorders of nose and nasal sinuses: Secondary | ICD-10-CM | POA: Insufficient documentation

## 2022-12-26 DIAGNOSIS — Z801 Family history of malignant neoplasm of trachea, bronchus and lung: Secondary | ICD-10-CM | POA: Insufficient documentation

## 2022-12-26 DIAGNOSIS — Z79899 Other long term (current) drug therapy: Secondary | ICD-10-CM | POA: Diagnosis not present

## 2022-12-26 DIAGNOSIS — R636 Underweight: Secondary | ICD-10-CM | POA: Diagnosis not present

## 2022-12-26 DIAGNOSIS — Z833 Family history of diabetes mellitus: Secondary | ICD-10-CM | POA: Insufficient documentation

## 2022-12-26 DIAGNOSIS — F17210 Nicotine dependence, cigarettes, uncomplicated: Secondary | ICD-10-CM | POA: Insufficient documentation

## 2022-12-26 DIAGNOSIS — L501 Idiopathic urticaria: Secondary | ICD-10-CM | POA: Insufficient documentation

## 2022-12-26 DIAGNOSIS — Z803 Family history of malignant neoplasm of breast: Secondary | ICD-10-CM | POA: Insufficient documentation

## 2022-12-26 DIAGNOSIS — D75839 Thrombocytosis, unspecified: Secondary | ICD-10-CM | POA: Diagnosis not present

## 2022-12-26 DIAGNOSIS — Z8249 Family history of ischemic heart disease and other diseases of the circulatory system: Secondary | ICD-10-CM | POA: Insufficient documentation

## 2022-12-26 DIAGNOSIS — Z8349 Family history of other endocrine, nutritional and metabolic diseases: Secondary | ICD-10-CM | POA: Insufficient documentation

## 2022-12-26 DIAGNOSIS — Z825 Family history of asthma and other chronic lower respiratory diseases: Secondary | ICD-10-CM | POA: Diagnosis not present

## 2022-12-26 DIAGNOSIS — D7589 Other specified diseases of blood and blood-forming organs: Secondary | ICD-10-CM | POA: Insufficient documentation

## 2022-12-26 DIAGNOSIS — D721 Eosinophilia, unspecified: Secondary | ICD-10-CM | POA: Diagnosis not present

## 2022-12-26 DIAGNOSIS — Z9049 Acquired absence of other specified parts of digestive tract: Secondary | ICD-10-CM | POA: Diagnosis not present

## 2022-12-26 NOTE — Patient Instructions (Signed)
[facility] at [facility] **VISIT SUMMARY & IMPORTANT INSTRUCTIONS **   You were seen today by Rojelio Brenner PA-C for your elevated platelets and elevated eosinophils.    ELEVATED EOSINOPHILS Your elevated eosinophils could certainly be the result of inflammation from your chronic hives.  However, they could also be the cause of your chronic hives. We will schedule you for bone marrow biopsy within the next 1-2 months.  ELEVATED PLATELETS Your platelets are most likely reactive from inflammation and smoking.  However, they could also represent a bone marrow disorder.  We will check biopsy as noted above.  FOLLOW-UP APPOINTMENT: Office visit 1 week after biopsy.  ** Thank you for trusting me with your healthcare!  I strive to provide all of my patients with quality care at each visit.  If you receive a survey for this visit, I would be so grateful to you for taking the time to provide feedback.  Thank you in advance!  ~ Kentavius Dettore                   Dr. Doreatha Massed   &   Rojelio Brenner, PA-C   - - - - - - - - - - - - - - - - - -    Thank you for choosing [facility] at [facility] to provide your oncology and hematology care.  To afford each patient quality time with our provider, please arrive at least 15 minutes before your scheduled appointment time.   If you have a lab appointment with the Cancer Center please come in thru the Main Entrance and check in at the main information desk.  You need to re-schedule your appointment should you arrive 10 or more minutes late.  We strive to give you quality time with our providers, and arriving late affects you and other patients whose appointments are after yours.  Also, if you no show three or more times for appointments you may be dismissed from the clinic at the providers discretion.     Again, thank you for choosing [facility].  Our hope is that these requests will  decrease the amount of time that you wait before being seen by our physicians.       _____________________________________________________________  Should you have questions after your visit to [facility], please contact our office at [phone] and follow the prompts.  Our office hours are 8:00 a.m. and 4:30 p.m. Monday - Friday.  Please note that voicemails left after 4:00 p.m. may not be returned until the following business day.  We are closed weekends and major holidays.  You do have access to a nurse 24-7, just call the main number to the clinic [phone] and do not press any options, hold on the line and a nurse will answer the phone.    For prescription refill requests, have your pharmacy contact our office and allow 72 hours.

## 2023-01-02 DIAGNOSIS — F17200 Nicotine dependence, unspecified, uncomplicated: Secondary | ICD-10-CM | POA: Diagnosis not present

## 2023-01-02 DIAGNOSIS — J3089 Other allergic rhinitis: Secondary | ICD-10-CM | POA: Diagnosis not present

## 2023-01-02 DIAGNOSIS — L2089 Other atopic dermatitis: Secondary | ICD-10-CM | POA: Diagnosis not present

## 2023-01-02 DIAGNOSIS — L501 Idiopathic urticaria: Secondary | ICD-10-CM | POA: Diagnosis not present

## 2023-01-31 HISTORY — PX: BONE MARROW BIOPSY: SHX199

## 2023-02-04 ENCOUNTER — Encounter: Payer: Self-pay | Admitting: Dermatology

## 2023-02-06 ENCOUNTER — Other Ambulatory Visit: Payer: Self-pay | Admitting: Radiology

## 2023-02-06 DIAGNOSIS — D721 Eosinophilia, unspecified: Secondary | ICD-10-CM

## 2023-02-06 NOTE — Consult Note (Signed)
Chief Complaint: Patient was seen in consultation today for image guided bone marrow biopsy  Referring Physician(s): Pennington,Rebekah M  Supervising Physician: Richarda Overlie  Patient Status: [facility] - Out-pt  History of Present Illness: Sherri Jackson is a 57 y.o. female smoker with past medical history of vitamin D deficiency as well as persistent thrombocytosis and eosinophilia of uncertain etiology.  She is scheduled today for image guided bone marrow biopsy for further evaluation.  Past Medical History:  Diagnosis Date   Elevated MCV 03/20/2016   Will check B12 and folate   Enlarged thyroid 12/01/2013   Peri-menopausal 12/01/2013   Vaginal Pap smear, abnormal    Vitamin D deficiency 03/20/2016    Past Surgical History:  Procedure Laterality Date   ANKLE SURGERY Left    APPENDECTOMY     HYSTEROSCOPY W/ ENDOMETRIAL ABLATION  2004   laser conization of cervix  2004    Allergies: Patient has no known allergies.  Medications: Prior to Admission medications   Medication Sig Start Date End Date Taking? Authorizing Provider  Acetaminophen (TYLENOL PO) Take by mouth.    [provider]  Biotin 5 MG TABS     [provider]  cetirizine (ZYRTEC) 10 MG tablet Take 10 mg by mouth daily.    [provider]  clobetasol (TEMOVATE) 0.05 % external solution Apply topically. 09/22/22   [provider]  famotidine (PEPCID) 40 MG tablet Take 40 mg by mouth 2 (two) times daily.    [provider]  fluticasone (FLONASE) 50 MCG/ACT nasal spray 1 spray in each nostril Nasally Once a day for 30 days 07/25/22   [provider]  hydrOXYzine (ATARAX) 25 MG tablet Take 25-50 mg by mouth at bedtime. 09/22/22   [provider]  ibuprofen (ADVIL,MOTRIN) 200 MG tablet Take 400 mg by mouth as needed.    [provider]  LORazepam (ATIVAN) 0.5 MG tablet Take 1 every 12 hours as needed 02/28/22   Adline Potter, NP  Multiple  Vitamin (MULTIVITAMIN) tablet Take 1 tablet by mouth daily.    [provider]  triamcinolone cream (KENALOG) 0.1 % Apply 1 application topically 2 (two) times daily. 03/19/19   [provider]  vitamin B-12 (CYANOCOBALAMIN) 100 MCG tablet     [provider]  VITAMIN D, ERGOCALCIFEROL, PO     [provider]     Family History  Problem Relation Age of Onset   Cancer Father        lung   COPD Father    Emphysema Father    Hyperlipidemia Father    Hypertension Father    Thyroid disease Father    Heart disease Paternal Grandmother    Heart attack Paternal Grandfather    Diabetes Paternal Uncle    Breast cancer Cousin    Breast cancer Cousin     Social History   Socioeconomic History   Marital status: Married    Spouse name: Not on file   Number of children: Not on file   Years of education: Not on file   Highest education level: Not on file  Occupational History   Not on file  Tobacco Use   Smoking status: Every Day    Current packs/day: 0.50    Average packs/day: 0.5 packs/day for 33.0 years (16.5 ttl pk-yrs)    Types: Cigarettes   Smokeless tobacco: Never  Vaping Use   Vaping status: Never Used  Substance and Sexual Activity   Alcohol use: Yes  Comment: occ   Drug use: No   Sexual activity: Yes    Birth control/protection: Post-menopausal  Other Topics Concern   Not on file  Social History Narrative   Not on file   Social Determinants of Health   Financial Resource Strain: Low Risk  (02/28/2022)   Overall Financial Resource Strain (CARDIA)    Difficulty of Paying Living Expenses: Not hard at all  Food Insecurity: No Food Insecurity (02/28/2022)   Hunger Vital Sign    Worried About Running Out of Food in the Last Year: Never true    Ran Out of Food in the Last Year: Never true  Transportation Needs: No Transportation Needs (02/28/2022)   PRAPARE - Administrator, Civil Service (Medical): No    Lack of  Transportation (Non-Medical): No  Physical Activity: Sufficiently Active (02/28/2022)   Exercise Vital Sign    Days of Exercise per Week: 6 days    Minutes of Exercise per Session: 40 min  Stress: Stress Concern Present (02/28/2022)   [employer] of Occupational Health - Occupational Stress Questionnaire    Feeling of Stress : To some extent  Social Connections: Unknown (02/28/2022)   Social Connection and Isolation Panel [NHANES]    Frequency of Communication with Friends and Family: Twice a week    Frequency of Social Gatherings with Friends and Family: Once a week    Attends Religious Services: Patient declined    Database administrator or Organizations: Patient declined    Attends Banker Meetings: Patient declined    Marital Status: Married      Review of Systems: denies fever, HA,CP,dyspnea, cough, abd /back pain,N/V or bleeding  Vital Signs: LMP 10/01/2013 Comment: bleeding 09/2016  Code Status: FULL CODE  Physical Exam: awake/alert; chest- CTA bilat; heart- RRR; abd- soft,+BS,NT; no LE edema  Imaging: No results found.  Labs:  CBC: Recent Labs    10/29/22 1455  WBC 11.1*  HGB 13.0  HCT 37.8  PLT 535*    COAGS: No results for input(s): "INR", "APTT" in the last 8760 hours.  BMP: Recent Labs    02/28/22 0811 10/29/22 1455  NA 138 132*  K 4.7 3.8  CL 98 98  CO2 22 24  GLUCOSE 90 102*  BUN 7 10  CALCIUM 10.4* 9.1  CREATININE 0.67 0.78  GFRNONAA  --  >60    LIVER FUNCTION TESTS: Recent Labs    02/28/22 0811 10/29/22 1455  BILITOT 0.3 0.5  AST 25 24  ALT 18 21  ALKPHOS 71 60  PROT 7.1 6.9  ALBUMIN 4.8 4.3    TUMOR MARKERS: No results for input(s): "AFPTM", "CEA", "CA199", "CHROMGRNA" in the last 8760 hours.  Assessment and Plan: 57 y.o. female smoker with past medical history of vitamin D deficiency as well as persistent thrombocytosis and eosinophilia of uncertain etiology.  She is scheduled today for image guided bone  marrow biopsy for further evaluation.Risks and benefits of procedure was discussed with the patient  including, but not limited to bleeding, infection, damage to adjacent structures or low yield requiring additional tests.  All of the questions were answered and there is agreement to proceed.  Consent signed and in chart.    Thank you for this interesting consult.  I greatly enjoyed meeting Sherri Jackson and look forward to participating in their care.  A copy of this report was sent to the requesting provider on this date.  Electronically Signed: D. Jeananne Rama, PA-C 02/06/2023,  5:45 PM   I spent a total of 20 minutes  in face to face in clinical consultation, greater than 50% of which was counseling/coordinating care for image guided bone marrow biopsy

## 2023-02-07 ENCOUNTER — Encounter (HOSPITAL_COMMUNITY): Payer: Self-pay

## 2023-02-07 ENCOUNTER — Other Ambulatory Visit: Payer: Self-pay

## 2023-02-07 ENCOUNTER — Ambulatory Visit (HOSPITAL_COMMUNITY)
Admission: RE | Admit: 2023-02-07 | Discharge: 2023-02-07 | Disposition: A | Discharge status: Home / Self Care | Payer: Federal, State, Local not specified - PPO | Source: Ambulatory Visit | Attending: Physician Assistant | Admitting: Physician Assistant

## 2023-02-07 DIAGNOSIS — D75839 Thrombocytosis, unspecified: Secondary | ICD-10-CM | POA: Diagnosis not present

## 2023-02-07 DIAGNOSIS — D721 Eosinophilia, unspecified: Secondary | ICD-10-CM | POA: Insufficient documentation

## 2023-02-07 LAB — CBC WITH DIFFERENTIAL/PLATELET
Abs Immature Granulocytes: 0.04 10*3/uL (ref 0.00–0.07)
Basophils Absolute: 0.1 10*3/uL (ref 0.0–0.1)
Basophils Relative: 1 %
Eosinophils Absolute: 1.6 10*3/uL — ABNORMAL HIGH (ref 0.0–0.5)
Eosinophils Relative: 16 %
HCT: 39.3 % (ref 36.0–46.0)
Hemoglobin: 13.4 g/dL (ref 12.0–15.0)
Immature Granulocytes: 0 %
Lymphocytes Relative: 18 %
Lymphs Abs: 1.9 10*3/uL (ref 0.7–4.0)
MCH: 33.4 pg (ref 26.0–34.0)
MCHC: 34.1 g/dL (ref 30.0–36.0)
MCV: 98 fL (ref 80.0–100.0)
Monocytes Absolute: 0.6 10*3/uL (ref 0.1–1.0)
Monocytes Relative: 6 %
Neutro Abs: 6.1 10*3/uL (ref 1.7–7.7)
Neutrophils Relative %: 59 %
Platelets: 513 10*3/uL — ABNORMAL HIGH (ref 150–400)
RBC: 4.01 MIL/uL (ref 3.87–5.11)
RDW: 12.7 % (ref 11.5–15.5)
WBC: 10.2 10*3/uL (ref 4.0–10.5)
nRBC: 0 % (ref 0.0–0.2)

## 2023-02-07 MED ORDER — MIDAZOLAM HCL 2 MG/2ML IJ SOLN
INTRAMUSCULAR | Status: AC | PRN
Start: 2023-02-07 — End: 2023-02-07
  Administered 2023-02-07: .5 mg via INTRAVENOUS

## 2023-02-07 MED ORDER — FENTANYL CITRATE (PF) 100 MCG/2ML IJ SOLN
INTRAMUSCULAR | Status: AC | PRN
Start: 2023-02-07 — End: 2023-02-07
  Administered 2023-02-07: 25 ug via INTRAVENOUS

## 2023-02-07 MED ORDER — NALOXONE HCL 0.4 MG/ML IJ SOLN
INTRAMUSCULAR | Status: AC
  Filled 2023-02-07: qty 1

## 2023-02-07 MED ORDER — FENTANYL CITRATE (PF) 100 MCG/2ML IJ SOLN
INTRAMUSCULAR | Status: AC | PRN
Start: 2023-02-07 — End: 2023-02-07
  Administered 2023-02-07: 50 ug via INTRAVENOUS

## 2023-02-07 MED ORDER — MIDAZOLAM HCL 2 MG/2ML IJ SOLN
INTRAMUSCULAR | Status: AC | PRN
Start: 2023-02-07 — End: 2023-02-07
  Administered 2023-02-07: 1 mg via INTRAVENOUS

## 2023-02-07 MED ORDER — MIDAZOLAM HCL 2 MG/2ML IJ SOLN
INTRAMUSCULAR | Status: AC | PRN
  Administered 2023-02-07: .5 mg via INTRAVENOUS

## 2023-02-07 MED ORDER — FLUMAZENIL 0.5 MG/5ML IV SOLN
INTRAVENOUS | Status: AC
  Filled 2023-02-07: qty 5

## 2023-02-07 MED ORDER — LIDOCAINE HCL (PF) 1 % IJ SOLN
INTRAMUSCULAR | Status: AC | PRN
  Administered 2023-02-07: 5 mL via INTRADERMAL

## 2023-02-07 MED ORDER — FENTANYL CITRATE (PF) 100 MCG/2ML IJ SOLN
INTRAMUSCULAR | Status: AC
  Filled 2023-02-07: qty 4

## 2023-02-07 MED ORDER — SODIUM CHLORIDE 0.9 % IV SOLN: INTRAVENOUS | Status: DC

## 2023-02-07 MED ORDER — MIDAZOLAM HCL 2 MG/2ML IJ SOLN
INTRAMUSCULAR | Status: AC
  Filled 2023-02-07: qty 4

## 2023-02-07 NOTE — Discharge Instructions (Signed)
Please call Interventional Radiology clinic [phone] with any questions or concerns. ? ?You may remove your dressing and shower tomorrow. ? ? ?Bone Marrow Aspiration and Bone Marrow Biopsy, Adult, Care After ?This sheet gives you information about how to care for yourself after your procedure. Your health care provider may also give you more specific instructions. If you have problems or questions, contact your health care provider. ?What can I expect after the procedure? ?After the procedure, it is common to have: ?Mild pain and tenderness. ?Swelling. ?Bruising. ?Follow these instructions at home: ?Puncture site care ?Follow instructions from your health care provider about how to take care of the puncture site. Make sure you: ?Wash your hands with soap and water before and after you change your bandage (dressing). If soap and water are not available, use hand sanitizer. ?Change your dressing as told by your health care provider. ?Check your puncture site every day for signs of infection. Check for: ?More redness, swelling, or pain. ?Fluid or blood. ?Warmth. ?Pus or a bad smell.   ?Activity ?Return to your normal activities as told by your health care provider. Ask your health care provider what activities are safe for you. ?Do not lift anything that is heavier than 10 lb (4.5 kg), or the limit that you are told, until your health care provider says that it is safe. ?Do not drive for 24 hours if you were given a sedative during your procedure. ?General instructions ?Take over-the-counter and prescription medicines only as told by your health care provider. ?Do not take baths, swim, or use a hot tub until your health care provider approves. Ask your health care provider if you may take showers. You may only be allowed to take sponge baths. ?If directed, put ice on the affected area. To do this: ?Put ice in a plastic bag. ?Place a towel between your skin and the bag. ?Leave the ice on for 20 minutes, 2-3 times a  day. ?Keep all follow-up visits as told by your health care provider. This is important.   ?Contact a health care provider if: ?Your pain is not controlled with medicine. ?You have a fever. ?You have more redness, swelling, or pain around the puncture site. ?You have fluid or blood coming from the puncture site. ?Your puncture site feels warm to the touch. ?You have pus or a bad smell coming from the puncture site. ?Summary ?After the procedure, it is common to have mild pain, tenderness, swelling, and bruising. ?Follow instructions from your health care provider about how to take care of the puncture site and what activities are safe for you. ?Take over-the-counter and prescription medicines only as told by your health care provider. ?Contact a health care provider if you have any signs of infection, such as fluid or blood coming from the puncture site. ?This information is not intended to replace advice given to you by your health care provider. Make sure you discuss any questions you have with your health care provider. ?Document Revised: 11/04/2018 Document Reviewed: 11/04/2018 ?Elsevier Patient Education ? 2021 Elsevier Inc. ? ? ?Moderate Conscious Sedation, Adult, Care After ?This sheet gives you information about how to care for yourself after your procedure. Your health care provider may also give you more specific instructions. If you have problems or questions, contact your health care provider. ?What can I expect after the procedure? ?After the procedure, it is common to have: ?Sleepiness for several hours. ?Impaired judgment for several hours. ?Difficulty with balance. ?Vomiting if you eat too   soon. ?Follow these instructions at home: ?For the time period you were told by your health care provider: ?Rest. ?Do not participate in activities where you could fall or become injured. ?Do not drive or use machinery. ?Do not drink alcohol. ?Do not take sleeping pills or medicines that cause drowsiness. ?Do not  make important decisions or sign legal documents. ?Do not take care of children on your own.  ?  ?  ?Eating and drinking ?Follow the diet recommended by your health care provider. ?Drink enough fluid to keep your urine pale yellow. ?If you vomit: ?Drink water, juice, or soup when you can drink without vomiting. ?Make sure you have little or no nausea before eating solid foods.   ?General instructions ?Take over-the-counter and prescription medicines only as told by your health care provider. ?Have a responsible adult stay with you for the time you are told. It is important to have someone help care for you until you are awake and alert. ?Do not smoke. ?Keep all follow-up visits as told by your health care provider. This is important. ?Contact a health care provider if: ?You are still sleepy or having trouble with balance after 24 hours. ?You feel light-headed. ?You keep feeling nauseous or you keep vomiting. ?You develop a rash. ?You have a fever. ?You have redness or swelling around the IV site. ?Get help right away if: ?You have trouble breathing. ?You have new-onset confusion at home. ?Summary ?After the procedure, it is common to feel sleepy, have impaired judgment, or feel nauseous if you eat too soon. ?Rest after you get home. Know the things you should not do after the procedure. ?Follow the diet recommended by your health care provider and drink enough fluid to keep your urine pale yellow. ?Get help right away if you have trouble breathing or new-onset confusion at home. ?This information is not intended to replace advice given to you by your health care provider. Make sure you discuss any questions you have with your health care provider. ?Document Revised: 10/16/2019 Document Reviewed: 05/14/2019 ?Elsevier Patient Education ? 2021 Elsevier Inc.  ?

## 2023-02-07 NOTE — Procedures (Signed)
Interventional Radiology Procedure:   Indications: Thrombocytosis and eosinophilia  Procedure: CT guided bone marrow biopsy  Findings: 2 aspirates and 1 core from right ilium  Complications: None     EBL: Minimal, less than 10 ml  Plan: Discharge to home in one hour.   Zakar Brosch R. Lowella Dandy, MD  Pager: [phone]

## 2023-02-15 ENCOUNTER — Encounter (HOSPITAL_COMMUNITY): Payer: Self-pay | Admitting: Physician Assistant

## 2023-02-19 NOTE — Progress Notes (Unsigned)
[facility] [address], [place] [address]   CLINIC:  Medical Oncology/Hematology  PCP:  Patient, No Pcp Per No address on file None   REASON FOR VISIT:  Follow-up for thrombocytosis and eosinophilia  PRIOR THERAPY: None  CURRENT THERAPY: Surveillance   INTERVAL HISTORY:   Ms. Lafauci 57 y.o. female returns for routine follow-up of thrombocytosis and eosinophilia.  She was last seen by Rojelio Brenner PA-C on 12/26/2022  At today's visit, she reports feeling fairly well.*** ***  She continues to deal with chronic urticaria, but this has been slightly improved over the past month.   ***She does also have seasonal allergies and rhinorrhea.  *** Shortness of breath, cough, wheezing *** Acid reflux, dysphagia, vomiting, abdominal pain, heartburn/noncardiac chest pain, prior EGD? *** She denies any constitutional symptoms such as fever, chills, night sweats, unintentional weight loss.  She denies any vasomotor symptoms or history of prior thrombosis.  *** ***She continues to smoke daily, but has cut down to about 0.25 PPD cigarettes.  She has 100***% energy and 100***% appetite. She endorses that she is maintaining a stable weight.  ASSESSMENT & PLAN:  1.   Thrombocytosis: - Longstanding history of thrombocytosis with platelet count mostly between 500s and 600s, present since at least 2015.   - Mutational testing NEGATIVE for JAK2 V617F, CALR, MPL, Exons 12-15, and BCR-ABL  - No history of DVT, PE, MI, CVA.   No connective tissue disorder.  Rheumatoid factor and ANA negative.  ESR and CRP were normal. - No aquagenic pruritus or vasomotor symptoms.  No prior history of thrombosis. - She has been diagnosed with idiopathic urticaria and chronic allergies.   - Physical exam does not reveal any palpable lymphadenopathy or splenomegaly. - Most recent CBC/D (02/07/2023): Platelets 513.  Prior LDH is normal. - Bone marrow biopsy (02/07/2023): Normocellular bone  marrow for age with eosinophilic hyperplasia.  Abundant megakaryocytes with scattered large forms. - DIFFERENTIAL DIAGNOSIS favors reactive thrombocytosis and leukocytosis from cigarette smoking, steroid use, and allergic disorder - PLAN: Continue periodic surveillance  2.  Eosinophilia - She has had intermittent leukocytosis since 2017. - Persistent elevations in eosinophils since at least 2019 (no prior differentials available).  Neutrophils, lymphocytes, and monocytes have been normal. - No signs of end organ involvement on imaging.  (Reviewed CT CAP from 03/19/2022 which showed small benign lymph nodes in the bilateral axillary regions.  No pathological lymphadenopathy.  No lung nodules.  No splenomegaly.) - Follows with [facility] (Dr. Corozal Callas) for idiopathic urticaria, allergic rhinitis, and atopic dermatitis. - No history of asthma, but imaging findings consistent with COPD. - Skin biopsy (04/16/2022) showed "perivascular dermatitis with eosinophils" (moderately dense inflammatory infiltrate in the dermis primarily in the perivascular distribution; infiltrate contains lymphocytes, eosinophils and occasional histiocytes; differential diagnosis from histological standpoint includes florid urticaria, papular urticaria, drug reaction, insect bite reaction, or other arthropod bites." - She uses Kenalog cream for rash. - She does not have any B symptoms or vasomotor symptoms.  *** - *** Pulmonary or GI symptoms *** - Eosinophilia workup (11/12/2022):  No mutations of CHIC2, PDGFRA, PDGFRB, or FGFR1 observed. Flow cytometry shows predominance of T cells with nonspecific changes, but no monoclonal B-cell population identified Mild elevation of vitamin B12 at 1077 (denies any current B12 supplementation) Normal IgE levels, troponin, tryptase, ESR, CRP, EBV DNA, ANA. - Most recent CBC/differential (02/07/2023) with WBC 10.2 and eosinophils 1.6. - Bone marrow biopsy (02/07/2023): Normocellular bone  marrow for age with  trilineage hematopoiesis, but with increased number of eosinophilic cells.  Significant dyspoiesis or increase in blastic cells is not identified.  Overall findings are not considered specific or diagnostic of a myeloid neoplasm and may be secondary in nature especially in the absence of clonal abnormalities. - Cytogenetics showed normal female karyotype 46,XX[20] - Overall findings suggest secondary eosinophilia from chronic urticaria and other allergic type symptoms (rather than primary eosinophilia the cause of the symptoms).  No strong evidence to suggest clonal eosinophilia or MPN at this time. - PLAN: Recommend continued follow-up with allergist.  Patient may consider referral to pulmonologist or gastroenterologist, depending on symptoms.  *** - We will continue surveillance with repeat CBC/D and office visit in 6 months ***  3.  Macrocytosis - She has previously had some mild macrocytosis with MCV up to 101.7 - Most recent CBC (10/29/2022) shows normal MCV 98.2 - Previous testing for B12, folic acid, and TSH were normal  4.  Tobacco abuse - Current everyday smoker (0.5 PPD cigarettes x 33 years) - trying to cut back and eventually quit.  She is down to 5 cigarettes daily.   PLAN SUMMARY: >> Labs in 6 months = *** >> OFFICE visit in 6 months ***     REVIEW OF SYSTEMS:***  Review of Systems  Constitutional:  Negative for appetite change, chills, diaphoresis, fatigue, fever and unexpected weight change.  HENT:   Negative for lump/mass and nosebleeds.   Eyes:  Negative for eye problems.  Respiratory:  Negative for cough, hemoptysis and shortness of breath.   Cardiovascular:  Negative for chest pain, leg swelling and palpitations.  Gastrointestinal:  Negative for abdominal pain, blood in stool, constipation, diarrhea, nausea and vomiting.  Genitourinary:  Negative for hematuria.   Skin:  Positive for itching and rash.  Neurological:  Negative for dizziness,  headaches and light-headedness.  Hematological:  Does not bruise/bleed easily.     PHYSICAL EXAM:  ECOG PERFORMANCE STATUS: 1 - Symptomatic but completely ambulatory *** There were no vitals filed for this visit. There were no vitals filed for this visit. Physical Exam Constitutional:      Appearance: Normal appearance. She is underweight.  Cardiovascular:     Heart sounds: Normal heart sounds.  Pulmonary:     Breath sounds: Normal breath sounds.  Neurological:     General: No focal deficit present.     Mental Status: Mental status is at baseline.  Psychiatric:        Behavior: Behavior normal. Behavior is cooperative.     PAST MEDICAL/SURGICAL HISTORY:  Past Medical History:  Diagnosis Date   Elevated MCV 03/20/2016   Will check B12 and folate   Enlarged thyroid 12/01/2013   Peri-menopausal 12/01/2013   Vaginal Pap smear, abnormal    Vitamin D deficiency 03/20/2016   Past Surgical History:  Procedure Laterality Date   ANKLE SURGERY Left    APPENDECTOMY     HYSTEROSCOPY W/ ENDOMETRIAL ABLATION  2004   laser conization of cervix  2004    SOCIAL HISTORY:  Social History   Socioeconomic History   Marital status: Married    Spouse name: Not on file   Number of children: Not on file   Years of education: Not on file   Highest education level: Not on file  Occupational History   Not on file  Tobacco Use   Smoking status: Every Day    Current packs/day: 0.50    Average packs/day: 0.5 packs/day for 33.0 years (16.5 ttl pk-yrs)  Types: Cigarettes   Smokeless tobacco: Never  Vaping Use   Vaping status: Never Used  Substance and Sexual Activity   Alcohol use: Yes    Comment: occ   Drug use: No   Sexual activity: Yes    Birth control/protection: Post-menopausal  Other Topics Concern   Not on file  Social History Narrative   Not on file   Social Determinants of Health   Financial Resource Strain: Low Risk  (02/28/2022)   Overall Financial Resource Strain  (CARDIA)    Difficulty of Paying Living Expenses: Not hard at all  Food Insecurity: No Food Insecurity (02/28/2022)   Hunger Vital Sign    Worried About Running Out of Food in the Last Year: Never true    Ran Out of Food in the Last Year: Never true  Transportation Needs: No Transportation Needs (02/28/2022)   PRAPARE - Administrator, Civil Service (Medical): No    Lack of Transportation (Non-Medical): No  Physical Activity: Sufficiently Active (02/28/2022)   Exercise Vital Sign    Days of Exercise per Week: 6 days    Minutes of Exercise per Session: 40 min  Stress: Stress Concern Present (02/28/2022)   [employer] of Occupational Health - Occupational Stress Questionnaire    Feeling of Stress : To some extent  Social Connections: Unknown (02/28/2022)   Social Connection and Isolation Panel [NHANES]    Frequency of Communication with Friends and Family: Twice a week    Frequency of Social Gatherings with Friends and Family: Once a week    Attends Religious Services: Patient declined    Database administrator or Organizations: Patient declined    Attends Banker Meetings: Patient declined    Marital Status: Married  Catering manager Violence: Not At Risk (02/28/2022)   Humiliation, Afraid, Rape, and Kick questionnaire    Fear of Current or Ex-Partner: No    Emotionally Abused: No    Physically Abused: No    Sexually Abused: No    FAMILY HISTORY:  Family History  Problem Relation Age of Onset   Cancer Father        lung   COPD Father    Emphysema Father    Hyperlipidemia Father    Hypertension Father    Thyroid disease Father    Heart disease Paternal Grandmother    Heart attack Paternal Grandfather    Diabetes Paternal Uncle    Breast cancer Cousin    Breast cancer Cousin     CURRENT MEDICATIONS:  Outpatient Encounter Medications as of 02/20/2023  Medication Sig   Acetaminophen (TYLENOL PO) Take by mouth.   Biotin 5 MG TABS    cetirizine  (ZYRTEC) 10 MG tablet Take 10 mg by mouth daily.   clobetasol (TEMOVATE) 0.05 % external solution Apply topically.   famotidine (PEPCID) 40 MG tablet Take 40 mg by mouth 2 (two) times daily.   fluticasone (FLONASE) 50 MCG/ACT nasal spray 1 spray in each nostril Nasally Once a day for 30 days   hydrOXYzine (ATARAX) 25 MG tablet Take 25-50 mg by mouth at bedtime.   ibuprofen (ADVIL,MOTRIN) 200 MG tablet Take 400 mg by mouth as needed.   LORazepam (ATIVAN) 0.5 MG tablet Take 1 every 12 hours as needed   Multiple Vitamin (MULTIVITAMIN) tablet Take 1 tablet by mouth daily.   triamcinolone cream (KENALOG) 0.1 % Apply 1 application topically 2 (two) times daily.   vitamin B-12 (CYANOCOBALAMIN) 100 MCG tablet    VITAMIN D,  ERGOCALCIFEROL, PO    No facility-administered encounter medications on file as of 02/20/2023.    ALLERGIES:  Allergies  Allergen Reactions   Latex Rash    LABORATORY DATA:  I have reviewed the labs as listed.  CBC    Component Value Date/Time   WBC 10.2 02/07/2023 0746   RBC 4.01 02/07/2023 0746   HGB 13.4 02/07/2023 0746   HGB 13.5 12/19/2020 1639   HCT 39.3 02/07/2023 0746   HCT 41.2 12/19/2020 1639   PLT 513 (H) 02/07/2023 0746   PLT 583 (H) 12/19/2020 1639   MCV 98.0 02/07/2023 0746   MCV 101 (H) 12/19/2020 1639   MCH 33.4 02/07/2023 0746   MCHC 34.1 02/07/2023 0746   RDW 12.7 02/07/2023 0746   RDW 12.0 12/19/2020 1639   LYMPHSABS 1.9 02/07/2023 0746   MONOABS 0.6 02/07/2023 0746   EOSABS 1.6 (H) 02/07/2023 0746   BASOSABS 0.1 02/07/2023 0746      Latest Ref Rng & Units 10/29/2022    2:55 PM 02/28/2022    8:11 AM 03/08/2021    4:01 PM  CMP  Glucose 70 - 99 mg/dL 161  90  95   BUN 6 - 20 mg/dL 10  7  7    Creatinine 0.44 - 1.00 mg/dL 0.96  0.45  4.09   Sodium 135 - 145 mmol/L 132  138  134   Potassium 3.5 - 5.1 mmol/L 3.8  4.7  3.7   Chloride 98 - 111 mmol/L 98  98  101   CO2 22 - 32 mmol/L 24  22  24    Calcium 8.9 - 10.3 mg/dL 9.1  81.1  9.1    Total Protein 6.5 - 8.1 g/dL 6.9  7.1  7.1   Total Bilirubin 0.3 - 1.2 mg/dL 0.5  0.3  0.2   Alkaline Phos 38 - 126 U/L 60  71  64   AST 15 - 41 U/L 24  25  19    ALT 0 - 44 U/L 21  18  16      DIAGNOSTIC IMAGING:  I have independently reviewed the relevant imaging and discussed with the patient.   WRAP UP:  All questions were answered. The patient knows to call the clinic with any problems, questions or concerns.  Medical decision making: Moderate  Time spent on visit: I spent 20 minutes counseling the patient face to face. The total time spent in the appointment was 30 minutes and more than 50% was on counseling.  Carnella Guadalajara, PA-C  ***

## 2023-02-20 ENCOUNTER — Inpatient Hospital Stay: Payer: Federal, State, Local not specified - PPO | Attending: Physician Assistant | Admitting: Physician Assistant

## 2023-02-20 VITALS — BP 141/97 | HR 75 | Temp 98.2°F | Resp 17 | Ht 59.0 in | Wt 89.1 lb

## 2023-02-20 DIAGNOSIS — L299 Pruritus, unspecified: Secondary | ICD-10-CM | POA: Diagnosis not present

## 2023-02-20 DIAGNOSIS — Z833 Family history of diabetes mellitus: Secondary | ICD-10-CM | POA: Insufficient documentation

## 2023-02-20 DIAGNOSIS — F17210 Nicotine dependence, cigarettes, uncomplicated: Secondary | ICD-10-CM | POA: Insufficient documentation

## 2023-02-20 DIAGNOSIS — Z801 Family history of malignant neoplasm of trachea, bronchus and lung: Secondary | ICD-10-CM | POA: Insufficient documentation

## 2023-02-20 DIAGNOSIS — Z803 Family history of malignant neoplasm of breast: Secondary | ICD-10-CM | POA: Insufficient documentation

## 2023-02-20 DIAGNOSIS — Z8249 Family history of ischemic heart disease and other diseases of the circulatory system: Secondary | ICD-10-CM | POA: Insufficient documentation

## 2023-02-20 DIAGNOSIS — Z79899 Other long term (current) drug therapy: Secondary | ICD-10-CM | POA: Diagnosis not present

## 2023-02-20 DIAGNOSIS — J3489 Other specified disorders of nose and nasal sinuses: Secondary | ICD-10-CM | POA: Insufficient documentation

## 2023-02-20 DIAGNOSIS — D721 Eosinophilia, unspecified: Secondary | ICD-10-CM

## 2023-02-20 DIAGNOSIS — D7589 Other specified diseases of blood and blood-forming organs: Secondary | ICD-10-CM | POA: Insufficient documentation

## 2023-02-20 DIAGNOSIS — Z83438 Family history of other disorder of lipoprotein metabolism and other lipidemia: Secondary | ICD-10-CM | POA: Insufficient documentation

## 2023-02-20 DIAGNOSIS — Z8349 Family history of other endocrine, nutritional and metabolic diseases: Secondary | ICD-10-CM | POA: Insufficient documentation

## 2023-02-20 DIAGNOSIS — Z9049 Acquired absence of other specified parts of digestive tract: Secondary | ICD-10-CM | POA: Insufficient documentation

## 2023-02-20 DIAGNOSIS — Z825 Family history of asthma and other chronic lower respiratory diseases: Secondary | ICD-10-CM | POA: Insufficient documentation

## 2023-02-20 DIAGNOSIS — L501 Idiopathic urticaria: Secondary | ICD-10-CM | POA: Diagnosis not present

## 2023-02-20 DIAGNOSIS — D75839 Thrombocytosis, unspecified: Secondary | ICD-10-CM | POA: Diagnosis not present

## 2023-02-20 DIAGNOSIS — R636 Underweight: Secondary | ICD-10-CM | POA: Insufficient documentation

## 2023-02-20 DIAGNOSIS — L209 Atopic dermatitis, unspecified: Secondary | ICD-10-CM | POA: Diagnosis not present

## 2023-02-20 DIAGNOSIS — R21 Rash and other nonspecific skin eruption: Secondary | ICD-10-CM | POA: Diagnosis not present

## 2023-02-20 DIAGNOSIS — J309 Allergic rhinitis, unspecified: Secondary | ICD-10-CM | POA: Insufficient documentation

## 2023-02-26 ENCOUNTER — Encounter: Payer: Self-pay | Admitting: Family Medicine

## 2023-02-26 ENCOUNTER — Ambulatory Visit: Payer: Federal, State, Local not specified - PPO | Admitting: Family Medicine

## 2023-02-26 VITALS — BP 150/91 | HR 85 | Ht 59.0 in | Wt 89.0 lb

## 2023-02-26 DIAGNOSIS — E782 Mixed hyperlipidemia: Secondary | ICD-10-CM | POA: Diagnosis not present

## 2023-02-26 DIAGNOSIS — R030 Elevated blood-pressure reading, without diagnosis of hypertension: Secondary | ICD-10-CM

## 2023-02-26 DIAGNOSIS — I10 Essential (primary) hypertension: Secondary | ICD-10-CM | POA: Diagnosis not present

## 2023-02-26 DIAGNOSIS — E538 Deficiency of other specified B group vitamins: Secondary | ICD-10-CM | POA: Diagnosis not present

## 2023-02-26 DIAGNOSIS — F419 Anxiety disorder, unspecified: Secondary | ICD-10-CM

## 2023-02-26 DIAGNOSIS — E559 Vitamin D deficiency, unspecified: Secondary | ICD-10-CM | POA: Diagnosis not present

## 2023-02-26 DIAGNOSIS — Z1329 Encounter for screening for other suspected endocrine disorder: Secondary | ICD-10-CM

## 2023-02-26 DIAGNOSIS — Z23 Encounter for immunization: Secondary | ICD-10-CM | POA: Diagnosis not present

## 2023-02-26 DIAGNOSIS — Z131 Encounter for screening for diabetes mellitus: Secondary | ICD-10-CM

## 2023-02-26 MED ORDER — LORAZEPAM 0.5 MG PO TABS: 0.5000 mg | ORAL_TABLET | Freq: Once | ORAL | 0 refills | Status: DC | PRN

## 2023-02-26 NOTE — Assessment & Plan Note (Signed)
Vitals:   02/26/23 0801  BP: (!) 150/91  Follow up in one week with at home blood pressure reading to evaluate trends. Continued discussion on DASH diet, low sodium diet and maintain a exercise routine for 150 minutes per week.

## 2023-02-26 NOTE — Patient Instructions (Addendum)

## 2023-02-26 NOTE — Progress Notes (Signed)
New Patient Office Visit   Subjective   Patient ID: Sherri Jackson, female    DOB: [birth year]  Age: 57 y.o. MRN: [id number]  CC:  Chief Complaint  Patient presents with   Establish Care    Pt. Would like to establish care, she would like to discuss her hypertension, and she has recently had blood work done if it is in care everywhere. Would like to discuss smoking cessation.    Immunizations    Pt. Needs a Tdap. She has declined shingles She has scheduled her pap smear. She would like to schedule her for next years exam.     Medication Refill    Would like refill of [facility]     HPI Sherri Jackson 57 year old female, presents to establish care. She  has a past medical history of Elevated MCV (03/20/2016), Enlarged thyroid (12/01/2013), Peri-menopausal (12/01/2013), Vaginal Pap smear, abnormal, and Vitamin D deficiency (03/20/2016).  Patient here for elevated blood pressure. She is exercising patient reports walks 4- 6 miles per day and is not adherent to low salt diet.  Blood pressure is unknown if  controlled at home. Patient denies cardiac symptoms chest pain, chest pressure/discomfort, dyspnea, exertional chest pressure/discomfort, and lower extremity edema.  Cardiovascular risk factors: dyslipidemia and smoking/ tobacco exposure.   Anxiety Presents for initial visit. Patient reported anxiety has been gradually worsening since onset. Symptoms include excessive worry, insomnia, nervous/anxious behavior and restlessness. Patient reports no suicidal ideas. Symptoms occur occasionally. The severity of symptoms is mild. The symptoms are aggravated by work stress and family issues. The patient sleeps 6 hours per night. The quality of sleep is fair. Risk factors include a major life event.There is no history of anxiety/panic attacks or suicide attempts. Past treatments include benzodiazephines. The treatment provided significant relief. Compliance with prior treatments has been good. Patient  is not interested in SSRI medication for long term therapy.       Outpatient Encounter Medications as of 02/26/2023  Medication Sig   Acetaminophen (TYLENOL PO) Take by mouth.   cetirizine (ZYRTEC) 10 MG tablet Take 10 mg by mouth daily.   hydrOXYzine (ATARAX) 25 MG tablet Take 25-50 mg by mouth at bedtime.   LORazepam (ATIVAN) 0.5 MG tablet Take 1 tablet (0.5 mg total) by mouth once as needed for up to 1 dose for anxiety.   Multiple Vitamin (MULTIVITAMIN) tablet Take 1 tablet by mouth daily.   triamcinolone cream (KENALOG) 0.1 % Apply 1 application topically 2 (two) times daily.   VITAMIN D, ERGOCALCIFEROL, PO    [DISCONTINUED] LORazepam (ATIVAN) 0.5 MG tablet Take 1 every 12 hours as needed   [DISCONTINUED] Biotin 5 MG TABS  (Patient not taking: Reported on 02/26/2023)   [DISCONTINUED] clobetasol (TEMOVATE) 0.05 % external solution Apply topically. (Patient not taking: Reported on 02/26/2023)   [DISCONTINUED] famotidine (PEPCID) 40 MG tablet Take 40 mg by mouth 2 (two) times daily. (Patient not taking: Reported on 02/26/2023)   [DISCONTINUED] fluticasone (FLONASE) 50 MCG/ACT nasal spray 1 spray in each nostril Nasally Once a day for 30 days (Patient not taking: Reported on 02/26/2023)   [DISCONTINUED] ibuprofen (ADVIL,MOTRIN) 200 MG tablet Take 400 mg by mouth as needed. (Patient not taking: Reported on 02/26/2023)   [DISCONTINUED] vitamin B-12 (CYANOCOBALAMIN) 100 MCG tablet  (Patient not taking: Reported on 02/26/2023)   No facility-administered encounter medications on file as of 02/26/2023.    Past Surgical History:  Procedure Laterality Date   ANKLE SURGERY Left  APPENDECTOMY     HYSTEROSCOPY W/ ENDOMETRIAL ABLATION  2004   laser conization of cervix  2004    Review of Systems  Constitutional:  Negative for chills and fever.  Respiratory:  Negative for shortness of breath.   Cardiovascular:  Negative for chest pain.  Gastrointestinal:  Negative for abdominal pain.   Neurological:  Negative for dizziness and headaches.  Psychiatric/Behavioral:  The patient is nervous/anxious and has insomnia.       Objective    BP (!) 150/91 (BP Location: Left Arm, Patient Position: Sitting)   Pulse 85   Ht 4\' 11"  (1.499 m)   Wt 89 lb 0.6 oz (40.4 kg)   LMP 10/01/2013 Comment: bleeding 09/2016  SpO2 98%   BMI 17.98 kg/m   Physical Exam Vitals reviewed.  Constitutional:      General: She is not in acute distress.    Appearance: Normal appearance. She is not ill-appearing, toxic-appearing or diaphoretic.  HENT:     Head: Normocephalic.     Right Ear: Tympanic membrane normal.     Left Ear: Tympanic membrane normal.     Mouth/Throat:     Mouth: Mucous membranes are moist.  Eyes:     General:        Right eye: No discharge.        Left eye: No discharge.     Conjunctiva/sclera: Conjunctivae normal.     Pupils: Pupils are equal, round, and reactive to light.  Cardiovascular:     Rate and Rhythm: Normal rate.     Pulses: Normal pulses.     Heart sounds: Normal heart sounds.  Pulmonary:     Effort: Pulmonary effort is normal. No respiratory distress.     Breath sounds: Normal breath sounds.  Abdominal:     General: Bowel sounds are normal.     Palpations: Abdomen is soft.     Tenderness: There is no abdominal tenderness. There is no right CVA tenderness, left CVA tenderness or guarding.  Musculoskeletal:        General: Normal range of motion.     Cervical back: Normal range of motion and neck supple.  Skin:    General: Skin is warm and dry.  Neurological:     General: No focal deficit present.     Mental Status: She is alert.     Coordination: Coordination normal.     Gait: Gait normal.  Psychiatric:        Mood and Affect: Mood normal.        Behavior: Behavior normal.       Assessment & Plan:  Screening for diabetes mellitus -     Microalbumin / creatinine urine ratio -     Hemoglobin A1c  Vitamin D deficiency -     VITAMIN D 25  Hydroxy (Vit-D Deficiency, Fractures)  Screening for thyroid disorder -     TSH + free T4  Mixed hyperlipidemia -     Lipid panel  Primary hypertension -     CMP14+EGFR  Vitamin B 12 deficiency -     B12 and Folate Panel  Encounter for immunization -     Flu Vaccine Trivalent High Dose (Fluad)  Elevated blood pressure reading Assessment & Plan: Vitals:   02/26/23 0801  BP: (!) 150/91  Follow up in one week with at home blood pressure reading to evaluate trends. Continued discussion on DASH diet, low sodium diet and maintain a exercise routine for 150 minutes per week.  Anxiety Assessment & Plan:    02/26/2023    8:19 AM 02/28/2022    8:34 AM 12/19/2020    2:50 PM 11/17/2019    8:34 AM  GAD 7 : Generalized Anxiety Score  Nervous, Anxious, on Edge 1 0 0 1  Control/stop worrying 1 0 0 1  Worry too much - different things 1 0 0 1  Trouble relaxing 1 0 0 1  Restless 1 0 0 1  Easily annoyed or irritable 0 0 0 1  Afraid - awful might happen 1 0 0 1  Total GAD 7 Score 6 0 0 7  Anxiety Difficulty Not difficult at all     Refilled Lorazepam 0.5 mg PRN Discussed about cognitive behavioral therapy focusing on thoughts, belief, and attitudes that affects feelings and behavior, learning about coping skills to deal with certain problems. Maintaining a consistent routine and schedule, Practice stress management and self calming techniques, excersise regularly and spend time outdoors, Do not eat food that are high in fat, added sugar, or salt.    Other orders -     LORazepam; Take 1 tablet (0.5 mg total) by mouth once as needed for up to 1 dose for anxiety.  Dispense: 10 tablet; Refill: 0    Return in about 6 weeks (around 04/09/2023), or if symptoms worsen or fail to improve, for re-check blood pressure.   Cruzita Lederer Newman Nip, FNP

## 2023-02-26 NOTE — Assessment & Plan Note (Signed)
    02/26/2023    8:19 AM 02/28/2022    8:34 AM 12/19/2020    2:50 PM 11/17/2019    8:34 AM  GAD 7 : Generalized Anxiety Score  Nervous, Anxious, on Edge 1 0 0 1  Control/stop worrying 1 0 0 1  Worry too much - different things 1 0 0 1  Trouble relaxing 1 0 0 1  Restless 1 0 0 1  Easily annoyed or irritable 0 0 0 1  Afraid - awful might happen 1 0 0 1  Total GAD 7 Score 6 0 0 7  Anxiety Difficulty Not difficult at all     Refilled Lorazepam 0.5 mg PRN Discussed about cognitive behavioral therapy focusing on thoughts, belief, and attitudes that affects feelings and behavior, learning about coping skills to deal with certain problems. Maintaining a consistent routine and schedule, Practice stress management and self calming techniques, excersise regularly and spend time outdoors, Do not eat food that are high in fat, added sugar, or salt.

## 2023-03-01 DIAGNOSIS — I10 Essential (primary) hypertension: Secondary | ICD-10-CM | POA: Diagnosis not present

## 2023-03-01 DIAGNOSIS — Z131 Encounter for screening for diabetes mellitus: Secondary | ICD-10-CM | POA: Diagnosis not present

## 2023-03-01 DIAGNOSIS — E782 Mixed hyperlipidemia: Secondary | ICD-10-CM | POA: Diagnosis not present

## 2023-03-01 DIAGNOSIS — E559 Vitamin D deficiency, unspecified: Secondary | ICD-10-CM | POA: Diagnosis not present

## 2023-03-01 DIAGNOSIS — Z1329 Encounter for screening for other suspected endocrine disorder: Secondary | ICD-10-CM | POA: Diagnosis not present

## 2023-03-04 LAB — CMP14+EGFR
ALT: 20 IU/L (ref 0–32)
AST: 33 IU/L (ref 0–40)
Albumin: 4.6 g/dL (ref 3.8–4.9)
Alkaline Phosphatase: 72 IU/L (ref 44–121)
BUN/Creatinine Ratio: 10 (ref 9–23)
BUN: 6 mg/dL (ref 6–24)
Bilirubin Total: 0.3 mg/dL (ref 0.0–1.2)
CO2: 20 mmol/L (ref 20–29)
Calcium: 9.9 mg/dL (ref 8.7–10.2)
Chloride: 100 mmol/L (ref 96–106)
Creatinine, Ser: 0.58 mg/dL (ref 0.57–1.00)
Globulin, Total: 2.5 g/dL (ref 1.5–4.5)
Glucose: 86 mg/dL (ref 70–99)
Potassium: 5.1 mmol/L (ref 3.5–5.2)
Sodium: 139 mmol/L (ref 134–144)
Total Protein: 7.1 g/dL (ref 6.0–8.5)
eGFR: 106 mL/min/{1.73_m2} (ref >59)

## 2023-03-04 LAB — LIPID PANEL
Chol/HDL Ratio: 3.1 ratio (ref 0.0–4.4)
Cholesterol, Total: 216 mg/dL — ABNORMAL HIGH (ref 100–199)
HDL: 70 mg/dL (ref >39)
LDL Chol Calc (NIH): 128 mg/dL — ABNORMAL HIGH (ref 0–99)
Triglycerides: 100 mg/dL (ref 0–149)
VLDL Cholesterol Cal: 18 mg/dL (ref 5–40)

## 2023-03-04 LAB — B12 AND FOLATE PANEL
Folate: 9.9 ng/mL (ref >3.0)
Vitamin B-12: 1042 pg/mL (ref 232–1245)

## 2023-03-04 LAB — MICROALBUMIN / CREATININE URINE RATIO
Creatinine, Urine: 22.5 mg/dL
Microalb/Creat Ratio: <13 mg/g{creat} (ref 0–29)
Microalbumin, Urine: <3 ug/mL

## 2023-03-04 LAB — VITAMIN D 25 HYDROXY (VIT D DEFICIENCY, FRACTURES): Vit D, 25-Hydroxy: 57.7 ng/mL (ref 30.0–100.0)

## 2023-03-04 LAB — TSH+FREE T4
Free T4: 1.37 ng/dL (ref 0.82–1.77)
TSH: 0.452 u[IU]/mL (ref 0.450–4.500)

## 2023-03-04 LAB — HEMOGLOBIN A1C
Est. average glucose Bld gHb Est-mCnc: 111 mg/dL
Hgb A1c MFr Bld: 5.5 % (ref 4.8–5.6)

## 2023-03-06 ENCOUNTER — Encounter: Payer: Self-pay | Admitting: Family Medicine

## 2023-03-13 DIAGNOSIS — L298 Other pruritus: Secondary | ICD-10-CM | POA: Diagnosis not present

## 2023-03-28 ENCOUNTER — Ambulatory Visit: Payer: Federal, State, Local not specified - PPO | Admitting: Adult Health

## 2023-03-28 ENCOUNTER — Other Ambulatory Visit (HOSPITAL_COMMUNITY)
Admission: RE | Admit: 2023-03-28 | Discharge: 2023-03-28 | Disposition: A | Discharge status: Home / Self Care | Payer: Federal, State, Local not specified - PPO | Source: Ambulatory Visit | Attending: Adult Health | Admitting: Adult Health

## 2023-03-28 ENCOUNTER — Encounter: Payer: Self-pay | Admitting: Adult Health

## 2023-03-28 VITALS — BP 169/96 | HR 64 | Ht 59.0 in | Wt 88.0 lb

## 2023-03-28 DIAGNOSIS — Z780 Asymptomatic menopausal state: Secondary | ICD-10-CM

## 2023-03-28 DIAGNOSIS — Z01419 Encounter for gynecological examination (general) (routine) without abnormal findings: Secondary | ICD-10-CM

## 2023-03-28 DIAGNOSIS — Z1211 Encounter for screening for malignant neoplasm of colon: Secondary | ICD-10-CM | POA: Diagnosis not present

## 2023-03-28 DIAGNOSIS — R030 Elevated blood-pressure reading, without diagnosis of hypertension: Secondary | ICD-10-CM | POA: Diagnosis not present

## 2023-03-28 LAB — HEMOCCULT GUIAC POC 1CARD (OFFICE): Fecal Occult Blood, POC: NEGATIVE

## 2023-03-28 NOTE — Progress Notes (Addendum)
Patient ID: Sherri Jackson, female   DOB: May 16, 1966, 57 y.o.   MRN: [id number] History of Present Illness: Sherri Jackson is a 57 year old white female, married, PM in for a well woman gyn exam and pap.  PCP is I Polanco NP   Current Medications, Allergies, Past Medical History, Past Surgical History, Family History and Social History were reviewed in [employer] record.     Review of Systems: Patient denies any headaches, hearing loss, fatigue, blurred vision, shortness of breath, chest pain, abdominal pain, problems with bowel movements, urination, or intercourse. No joint pain or mood swings.  Denies any vaginal bleeding    Physical Exam:BP (!) 169/96 (BP Location: Right Arm, Patient Position: Sitting, Cuff Size: Normal)   Pulse 64   Ht 4\' 11"  (1.499 m)   Wt 88 lb (39.9 kg)   LMP 10/01/2013 Comment: bleeding 09/2016  BMI 17.77 kg/m   General:  Well developed, well nourished, no acute distress Skin:  Warm and dry Neck:  Midline trachea, normal thyroid, good ROM, no lymphadenopathy Lungs; Clear to auscultation bilaterally Breast:  No dominant palpable mass, retraction, or nipple discharge Cardiovascular: Regular rate and rhythm Abdomen:  Soft, non tender, no hepatosplenomegaly Pelvic:  External genitalia is normal in appearance, no lesions.  The vagina is pale. Urethra has no lesions or masses. The cervix is smooth, and stenotic, deviated to the left, pap with HR HPV performed.  Uterus is felt to be normal size, shape, and contour.  No adnexal masses or tenderness noted.Bladder is non tender, no masses felt. Rectal: Good sphincter tone, no polyps, or hemorrhoids felt.  Hemoccult negative. Extremities/musculoskeletal:  No swelling or varicosities noted, no clubbing or cyanosis Psych:  No mood changes, alert and cooperative,seems happy AA is 1 Fall risk is low    03/28/2023    8:38 AM 02/26/2023    8:19 AM 02/28/2022    8:34 AM  Depression screen PHQ 2/9  Decreased  Interest 0 0 0  Down, Depressed, Hopeless 0 0 0  PHQ - 2 Score 0 0 0  Altered sleeping 0 0 0  Tired, decreased energy 0 0 0  Change in appetite 0 0 0  Feeling bad or failure about yourself  0 0 0  Trouble concentrating 0  0  Moving slowly or fidgety/restless 0 0 0  Suicidal thoughts 0 0 0  PHQ-9 Score 0 0 0  Difficult doing work/chores  Not difficult at all        03/28/2023    8:39 AM 02/26/2023    8:19 AM 02/28/2022    8:34 AM 12/19/2020    2:50 PM  GAD 7 : Generalized Anxiety Score  Nervous, Anxious, on Edge 1 1 0 0  Control/stop worrying 0 1 0 0  Worry too much - different things 1 1 0 0  Trouble relaxing 1 1 0 0  Restless 0 1 0 0  Easily annoyed or irritable 0 0 0 0  Afraid - awful might happen 1 1 0 0  Total GAD 7 Score 4 6 0 0  Anxiety Difficulty  Not difficult at all      Upstream - 03/28/23 4098       Pregnancy Intention Screening   Does the patient want to become pregnant in the next year? N/A    Does the patient's partner want to become pregnant in the next year? N/A    Would the patient like to discuss contraceptive options today? N/A  Contraception Wrap Up   Current Method No Method - Other Reason   PM   Reason for No Current Contraceptive Method at Intake ([facility] Only) Other    End Method No Method - Other Reason   PM   Contraception Counseling Provided No              Examination chaperoned by [place] [employer] NP student who assisted with exam.   Impression and Plan: 1. Encounter for gynecological examination with Papanicolaou smear of cervix Pap sent  Pap in 3 years if normal Physical in 1 year Mammogram was negative 07/26/22 Colonoscopy per GI  - Cytology - PAP( [facility])  2. Encounter for screening fecal occult blood testing Hemoccult was negative  - POCT occult blood stool  3. Postmenopausal Denies vaginal bleeding   4. Elevated BP without diagnosis of hypertension BP good at home Keep check on BP Follow up with PCP

## 2023-04-03 LAB — CYTOLOGY - PAP
Adequacy: ABSENT
Comment: NEGATIVE
Diagnosis: NEGATIVE
High risk HPV: NEGATIVE

## 2023-04-09 ENCOUNTER — Encounter: Payer: Self-pay | Admitting: Family Medicine

## 2023-04-09 ENCOUNTER — Ambulatory Visit: Payer: Federal, State, Local not specified - PPO | Admitting: Family Medicine

## 2023-04-09 VITALS — BP 154/92 | HR 90 | Ht 59.0 in | Wt 88.0 lb

## 2023-04-09 DIAGNOSIS — I10 Essential (primary) hypertension: Secondary | ICD-10-CM

## 2023-04-09 MED ORDER — AMLODIPINE BESYLATE 2.5 MG PO TABS
2.5000 mg | ORAL_TABLET | Freq: Every day | ORAL | 2 refills | Status: DC
Start: 2023-04-09 — End: 2023-07-05

## 2023-04-09 NOTE — Progress Notes (Signed)
Patient Office Visit   Subjective   Patient ID: Sherri Jackson, female    DOB: October 01, 1965  Age: 57 y.o. MRN: [id number]  CC:  Chief Complaint  Patient presents with   Hypertension    HPI Sherri Jackson 57 year old female, presents to the clinic for blood pressure follow up She  has a past medical history of Elevated MCV (03/20/2016), Enlarged thyroid (12/01/2013), Peri-menopausal (12/01/2013), Vaginal Pap smear, abnormal, and Vitamin D deficiency (03/20/2016).For the details of today's visit, please refer to assessment and plan.   Hypertension: Patient here for follow-up of elevated blood pressure. She is exercising walks 4-6 miles daily and is adherent to low salt diet.  Blood pressure is well controlled at home. Patient denies Cardiac symptoms chest pain, dyspnea, lower extremity edema, and tachypnea.Cardiovascular risk factors: dyslipidemia, hypertension, and smoking/ tobacco exposure.      Outpatient Encounter Medications as of 04/09/2023  Medication Sig   Acetaminophen (TYLENOL PO) Take by mouth.   amLODipine (NORVASC) 2.5 MG tablet Take 1 tablet (2.5 mg total) by mouth daily.   cetirizine (ZYRTEC) 10 MG tablet Take 10 mg by mouth daily.   clobetasol (TEMOVATE) 0.05 % external solution Apply 1 Application topically 2 (two) times daily.   hydrOXYzine (ATARAX) 25 MG tablet Take 25-50 mg by mouth at bedtime.   LORazepam (ATIVAN) 0.5 MG tablet Take 1 tablet (0.5 mg total) by mouth once as needed for up to 1 dose for anxiety.   Multiple Vitamin (MULTIVITAMIN) tablet Take 1 tablet by mouth daily.   triamcinolone cream (KENALOG) 0.1 % Apply 1 application topically 2 (two) times daily.   VITAMIN D, ERGOCALCIFEROL, PO    No facility-administered encounter medications on file as of 04/09/2023.    Past Surgical History:  Procedure Laterality Date   ANKLE SURGERY Left    APPENDECTOMY     BONE MARROW BIOPSY  01/2023   HYSTEROSCOPY W/ ENDOMETRIAL ABLATION  07/02/2002   laser  conization of cervix  07/02/2002    Review of Systems  Constitutional:  Negative for chills and fever.  Eyes:  Negative for blurred vision.  Respiratory:  Negative for shortness of breath.   Cardiovascular:  Negative for chest pain.  Genitourinary:  Negative for dysuria.  Neurological:  Negative for dizziness and headaches.      Objective    BP (!) 154/92   Pulse 90   Ht 4\' 11"  (1.499 m)   Wt 88 lb (39.9 kg)   LMP 10/01/2013 Comment: bleeding 09/2016  SpO2 98%   BMI 17.77 kg/m   Physical Exam Vitals reviewed.  Constitutional:      General: She is not in acute distress.    Appearance: Normal appearance. She is not ill-appearing, toxic-appearing or diaphoretic.  HENT:     Head: Normocephalic.  Eyes:     General:        Right eye: No discharge.        Left eye: No discharge.     Conjunctiva/sclera: Conjunctivae normal.  Cardiovascular:     Rate and Rhythm: Normal rate.     Pulses: Normal pulses.     Heart sounds: Normal heart sounds.  Pulmonary:     Effort: Pulmonary effort is normal. No respiratory distress.     Breath sounds: Normal breath sounds.  Musculoskeletal:        General: Normal range of motion.     Cervical back: Normal range of motion.  Skin:    General: Skin is warm and dry.  Capillary Refill: Capillary refill takes less than 2 seconds.  Neurological:     General: No focal deficit present.     Mental Status: She is alert and oriented to person, place, and time.     Coordination: Coordination normal.     Gait: Gait normal.  Psychiatric:        Mood and Affect: Mood normal.        Behavior: Behavior normal.       Assessment & Plan:  Primary hypertension Assessment & Plan: Vitals:   04/09/23 0810 04/09/23 0812  BP: (!) 158/95 (!) 154/92   Second visit with elevated blood pressure readings Started patient on Amlodipine 2.5 mg once daily Follow up in 4 weeks vit my chart with at home blood pressure readings. Explained non pharmacological  interventions such as low salt, DASH diet discussed. Educated on stress reduction and physical activity minimum 150 minutes per week. Discussed signs and symptoms of major cardiovascular event and need to present to the ED. Patient verbalizes understanding regarding plan of care and all questions answered.   Orders: -     amLODIPine Besylate; Take 1 tablet (2.5 mg total) by mouth daily.  Dispense: 30 tablet; Refill: 2    Return in about 3 months (around 07/10/2023), or if symptoms worsen or fail to improve, for hypertension.   Cruzita Lederer Newman Nip, FNP

## 2023-04-09 NOTE — Patient Instructions (Signed)
        Great to see you today.  I have refilled the medication(s) we provide.    - Please take medications as prescribed. - Follow up with your primary health provider if any health concerns arises. - If symptoms worsen please contact your primary care provider and/or visit the emergency department.  

## 2023-04-09 NOTE — Assessment & Plan Note (Signed)
Vitals:   04/09/23 0810 04/09/23 0812  BP: (!) 158/95 (!) 154/92   Second visit with elevated blood pressure readings Started patient on Amlodipine 2.5 mg once daily Follow up in 4 weeks vit my chart with at home blood pressure readings. Explained non pharmacological interventions such as low salt, DASH diet discussed. Educated on stress reduction and physical activity minimum 150 minutes per week. Discussed signs and symptoms of major cardiovascular event and need to present to the ED. Patient verbalizes understanding regarding plan of care and all questions answered.

## 2023-04-26 ENCOUNTER — Encounter: Payer: Self-pay | Admitting: Family Medicine

## 2023-06-18 ENCOUNTER — Other Ambulatory Visit: Payer: Self-pay | Admitting: Adult Health

## 2023-06-18 DIAGNOSIS — Z1231 Encounter for screening mammogram for malignant neoplasm of breast: Secondary | ICD-10-CM

## 2023-07-05 ENCOUNTER — Other Ambulatory Visit: Payer: Self-pay | Admitting: Family Medicine

## 2023-07-05 DIAGNOSIS — I10 Essential (primary) hypertension: Secondary | ICD-10-CM

## 2023-07-18 NOTE — Progress Notes (Signed)
Established Patient Office Visit   Subjective  Patient ID: Sherri Jackson, female    DOB: 1966-04-01  Age: 58 y.o. MRN: [id number]  Chief Complaint  Patient presents with   Follow-up    Lab follow up. Seeing low b/p 90/69     She  has a past medical history of Elevated MCV (03/20/2016), Enlarged thyroid (12/01/2013), Hyperlipidemia, Hypertension, Peri-menopausal (12/01/2013), Vaginal Pap smear, abnormal, and Vitamin D deficiency (03/20/2016).  HPI Patient presents to the clinic for hypertension and anxiety follow up. For the details of today's visit, please refer to assessment and plan.   Review of Systems  Constitutional:  Negative for chills and fever.  Eyes:  Negative for blurred vision.  Respiratory:  Negative for shortness of breath.   Cardiovascular:  Negative for chest pain.  Neurological:  Negative for dizziness.      Objective:     BP 134/80   Pulse 72   Ht 4\' 11"  (1.499 m)   Wt 88 lb 0.6 oz (39.9 kg)   LMP 10/01/2013 Comment: bleeding 09/2016  SpO2 96%   BMI 17.78 kg/m  BP Readings from Last 3 Encounters:  07/19/23 134/80  04/09/23 (!) 154/92  03/28/23 (!) 169/96      Physical Exam Vitals reviewed.  Constitutional:      General: She is not in acute distress.    Appearance: Normal appearance. She is not ill-appearing, toxic-appearing or diaphoretic.  HENT:     Head: Normocephalic.  Eyes:     General:        Right eye: No discharge.        Left eye: No discharge.     Conjunctiva/sclera: Conjunctivae normal.  Cardiovascular:     Rate and Rhythm: Normal rate.     Pulses: Normal pulses.     Heart sounds: Normal heart sounds.  Pulmonary:     Effort: Pulmonary effort is normal. No respiratory distress.     Breath sounds: Normal breath sounds.  Abdominal:     General: Bowel sounds are normal.     Palpations: Abdomen is soft.     Tenderness: There is no abdominal tenderness. There is no right CVA tenderness, left CVA tenderness or guarding.   Musculoskeletal:        General: Normal range of motion.     Cervical back: Normal range of motion.  Skin:    General: Skin is warm and dry.     Capillary Refill: Capillary refill takes less than 2 seconds.  Neurological:     Mental Status: She is alert.     Coordination: Coordination normal.     Gait: Gait normal.  Psychiatric:        Mood and Affect: Mood normal.      No results found for any visits on 07/19/23.  The 10-year ASCVD risk score (Arnett DK, et al., 2019) is: 6.6%    Assessment & Plan:  Primary hypertension Assessment & Plan: Vitals:   07/19/23 0814  BP: 134/80   Controlled on Amlodipine 2.5 mg once daily  Discussed with  patient to monitor their blood pressure regularly and maintain a heart-healthy diet rich in fruits, vegetables, whole grains, and low-fat dairy, while reducing sodium intake to less than 2,300 mg per day. Regular physical activity, such as 30 minutes of moderate exercise most days of the week, will help lower blood pressure and improve overall cardiovascular health. Avoiding smoking, limiting alcohol consumption, and managing stress. Take  prescribed medication, & take it as directed  and avoid skipping doses. Seek emergency care if your blood pressure is (over 180/100) or you experience chest pain, shortness of breath, or sudden vision changes.Patient verbalizes understanding regarding plan of care and all questions answered.   Orders: -     amLODIPine Besylate; Take 1 tablet (2.5 mg total) by mouth daily.  Dispense: 30 tablet; Refill: 2  Anxiety Assessment & Plan:    03/28/2023    8:39 AM 02/26/2023    8:19 AM 02/28/2022    8:34 AM 12/19/2020    2:50 PM  GAD 7 : Generalized Anxiety Score  Nervous, Anxious, on Edge 1 1 0 0  Control/stop worrying 0 1 0 0  Worry too much - different things 1 1 0 0  Trouble relaxing 1 1 0 0  Restless 0 1 0 0  Easily annoyed or irritable 0 0 0 0  Afraid - awful might happen 1 1 0 0  Total GAD 7 Score 4 6 0 0   Anxiety Difficulty  Not difficult at all     Controlled Refilled Lorazepam 0.5 mg PRN  We discussed several non-pharmacological approaches to managing anxiety  including:  Establishing a consistent daily routine: This helps create structure and stability. Practicing mindfulness and relaxation techniques: Incorporating meditation, deep breathing exercises, or yoga to manage stress and improve emotional well-being. Engaging in regular physical activity: Aim for at least 30 minutes of exercise most days to boost mood and energy levels. Spending time outdoors: Exposure to natural light and fresh air can improve mental health. Building a support network: Encouraging social connections with friends, family, or support groups to reduce feelings of isolation. Prioritizing a balanced diet: Eating nutrient-rich foods while avoiding excessive amounts of processed foods, sugar, and unhealthy fats. Follow-up is recommended in 4-8 weeks to assess progress, with a referral to behavioral health for further support if needed.  Patient verbally consented to [facility] services about presenting concerns and psychiatric consultation as appropriate. The services will be billed as appropriate for the patient.      Other orders -     LORazepam; Take 1 tablet (0.5 mg total) by mouth once as needed for up to 1 dose for anxiety.  Dispense: 20 tablet; Refill: 0    Return in about 6 months (around 01/16/2024), or if symptoms worsen or fail to improve, for hypertension.   Cruzita Lederer Newman Nip, FNP

## 2023-07-18 NOTE — Patient Instructions (Signed)

## 2023-07-19 ENCOUNTER — Encounter: Payer: Self-pay | Admitting: Family Medicine

## 2023-07-19 ENCOUNTER — Ambulatory Visit: Payer: Federal, State, Local not specified - PPO | Admitting: Family Medicine

## 2023-07-19 VITALS — BP 134/80 | HR 72 | Ht 59.0 in | Wt 88.0 lb

## 2023-07-19 DIAGNOSIS — F419 Anxiety disorder, unspecified: Secondary | ICD-10-CM | POA: Diagnosis not present

## 2023-07-19 DIAGNOSIS — I10 Essential (primary) hypertension: Secondary | ICD-10-CM

## 2023-07-19 MED ORDER — AMLODIPINE BESYLATE 2.5 MG PO TABS: 2.5000 mg | ORAL_TABLET | Freq: Every day | ORAL | 2 refills | Status: DC

## 2023-07-19 MED ORDER — LORAZEPAM 0.5 MG PO TABS: 0.5000 mg | ORAL_TABLET | Freq: Once | ORAL | 0 refills | Status: DC | PRN

## 2023-07-19 NOTE — Assessment & Plan Note (Signed)
Vitals:   07/19/23 0814  BP: 134/80   Controlled on Amlodipine 2.5 mg once daily  Discussed with  patient to monitor their blood pressure regularly and maintain a heart-healthy diet rich in fruits, vegetables, whole grains, and low-fat dairy, while reducing sodium intake to less than 2,300 mg per day. Regular physical activity, such as 30 minutes of moderate exercise most days of the week, will help lower blood pressure and improve overall cardiovascular health. Avoiding smoking, limiting alcohol consumption, and managing stress. Take  prescribed medication, & take it as directed and avoid skipping doses. Seek emergency care if your blood pressure is (over 180/100) or you experience chest pain, shortness of breath, or sudden vision changes.Patient verbalizes understanding regarding plan of care and all questions answered.

## 2023-07-19 NOTE — Assessment & Plan Note (Signed)
    03/28/2023    8:39 AM 02/26/2023    8:19 AM 02/28/2022    8:34 AM 12/19/2020    2:50 PM  GAD 7 : Generalized Anxiety Score  Nervous, Anxious, on Edge 1 1 0 0  Control/stop worrying 0 1 0 0  Worry too much - different things 1 1 0 0  Trouble relaxing 1 1 0 0  Restless 0 1 0 0  Easily annoyed or irritable 0 0 0 0  Afraid - awful might happen 1 1 0 0  Total GAD 7 Score 4 6 0 0  Anxiety Difficulty  Not difficult at all     Controlled Refilled Lorazepam 0.5 mg PRN  We discussed several non-pharmacological approaches to managing anxiety  including:  Establishing a consistent daily routine: This helps create structure and stability. Practicing mindfulness and relaxation techniques: Incorporating meditation, deep breathing exercises, or yoga to manage stress and improve emotional well-being. Engaging in regular physical activity: Aim for at least 30 minutes of exercise most days to boost mood and energy levels. Spending time outdoors: Exposure to natural light and fresh air can improve mental health. Building a support network: Encouraging social connections with friends, family, or support groups to reduce feelings of isolation. Prioritizing a balanced diet: Eating nutrient-rich foods while avoiding excessive amounts of processed foods, sugar, and unhealthy fats. Follow-up is recommended in 4-8 weeks to assess progress, with a referral to behavioral health for further support if needed.  Patient verbally consented to [facility] services about presenting concerns and psychiatric consultation as appropriate. The services will be billed as appropriate for the patient.

## 2023-07-30 ENCOUNTER — Ambulatory Visit
Admission: RE | Admit: 2023-07-30 | Discharge: 2023-07-30 | Disposition: A | Discharge status: Home / Self Care | Payer: Federal, State, Local not specified - PPO | Source: Ambulatory Visit | Attending: Adult Health | Admitting: Adult Health

## 2023-07-30 DIAGNOSIS — Z1231 Encounter for screening mammogram for malignant neoplasm of breast: Secondary | ICD-10-CM | POA: Diagnosis not present

## 2023-08-02 ENCOUNTER — Other Ambulatory Visit: Payer: Self-pay | Admitting: Adult Health

## 2023-08-02 ENCOUNTER — Encounter: Payer: Self-pay | Admitting: Adult Health

## 2023-08-02 DIAGNOSIS — R928 Other abnormal and inconclusive findings on diagnostic imaging of breast: Secondary | ICD-10-CM

## 2023-08-03 ENCOUNTER — Other Ambulatory Visit: Payer: Self-pay | Admitting: Adult Health

## 2023-08-03 ENCOUNTER — Ambulatory Visit
Admission: RE | Admit: 2023-08-03 | Discharge: 2023-08-03 | Disposition: A | Discharge status: Home / Self Care | Payer: Federal, State, Local not specified - PPO | Source: Ambulatory Visit | Attending: Adult Health | Admitting: Adult Health

## 2023-08-03 DIAGNOSIS — R921 Mammographic calcification found on diagnostic imaging of breast: Secondary | ICD-10-CM | POA: Diagnosis not present

## 2023-08-03 DIAGNOSIS — R928 Other abnormal and inconclusive findings on diagnostic imaging of breast: Secondary | ICD-10-CM

## 2023-08-09 ENCOUNTER — Ambulatory Visit
Admission: RE | Admit: 2023-08-09 | Discharge: 2023-08-09 | Disposition: A | Discharge status: Home / Self Care | Payer: Federal, State, Local not specified - PPO | Source: Ambulatory Visit | Attending: Adult Health | Admitting: Adult Health

## 2023-08-09 DIAGNOSIS — R921 Mammographic calcification found on diagnostic imaging of breast: Secondary | ICD-10-CM

## 2023-08-09 DIAGNOSIS — N6031 Fibrosclerosis of right breast: Secondary | ICD-10-CM | POA: Diagnosis not present

## 2023-08-09 HISTORY — PX: BREAST BIOPSY: SHX20

## 2023-08-12 LAB — SURGICAL PATHOLOGY

## 2023-08-20 ENCOUNTER — Inpatient Hospital Stay: Payer: Federal, State, Local not specified - PPO | Attending: Hematology

## 2023-08-20 DIAGNOSIS — Z83438 Family history of other disorder of lipoprotein metabolism and other lipidemia: Secondary | ICD-10-CM | POA: Insufficient documentation

## 2023-08-20 DIAGNOSIS — Z801 Family history of malignant neoplasm of trachea, bronchus and lung: Secondary | ICD-10-CM | POA: Insufficient documentation

## 2023-08-20 DIAGNOSIS — Z8249 Family history of ischemic heart disease and other diseases of the circulatory system: Secondary | ICD-10-CM | POA: Diagnosis not present

## 2023-08-20 DIAGNOSIS — Z8349 Family history of other endocrine, nutritional and metabolic diseases: Secondary | ICD-10-CM | POA: Insufficient documentation

## 2023-08-20 DIAGNOSIS — J3489 Other specified disorders of nose and nasal sinuses: Secondary | ICD-10-CM | POA: Insufficient documentation

## 2023-08-20 DIAGNOSIS — Z808 Family history of malignant neoplasm of other organs or systems: Secondary | ICD-10-CM | POA: Diagnosis not present

## 2023-08-20 DIAGNOSIS — L501 Idiopathic urticaria: Secondary | ICD-10-CM | POA: Insufficient documentation

## 2023-08-20 DIAGNOSIS — D72829 Elevated white blood cell count, unspecified: Secondary | ICD-10-CM | POA: Insufficient documentation

## 2023-08-20 DIAGNOSIS — Z833 Family history of diabetes mellitus: Secondary | ICD-10-CM | POA: Diagnosis not present

## 2023-08-20 DIAGNOSIS — Z825 Family history of asthma and other chronic lower respiratory diseases: Secondary | ICD-10-CM | POA: Insufficient documentation

## 2023-08-20 DIAGNOSIS — L299 Pruritus, unspecified: Secondary | ICD-10-CM | POA: Diagnosis not present

## 2023-08-20 DIAGNOSIS — R636 Underweight: Secondary | ICD-10-CM | POA: Diagnosis not present

## 2023-08-20 DIAGNOSIS — L209 Atopic dermatitis, unspecified: Secondary | ICD-10-CM | POA: Diagnosis not present

## 2023-08-20 DIAGNOSIS — Z803 Family history of malignant neoplasm of breast: Secondary | ICD-10-CM | POA: Insufficient documentation

## 2023-08-20 DIAGNOSIS — Z9049 Acquired absence of other specified parts of digestive tract: Secondary | ICD-10-CM | POA: Diagnosis not present

## 2023-08-20 DIAGNOSIS — R197 Diarrhea, unspecified: Secondary | ICD-10-CM | POA: Diagnosis not present

## 2023-08-20 DIAGNOSIS — D75839 Thrombocytosis, unspecified: Secondary | ICD-10-CM | POA: Diagnosis not present

## 2023-08-20 DIAGNOSIS — D7589 Other specified diseases of blood and blood-forming organs: Secondary | ICD-10-CM | POA: Diagnosis not present

## 2023-08-20 DIAGNOSIS — F17210 Nicotine dependence, cigarettes, uncomplicated: Secondary | ICD-10-CM | POA: Diagnosis not present

## 2023-08-20 DIAGNOSIS — E785 Hyperlipidemia, unspecified: Secondary | ICD-10-CM | POA: Insufficient documentation

## 2023-08-20 DIAGNOSIS — Z79899 Other long term (current) drug therapy: Secondary | ICD-10-CM | POA: Insufficient documentation

## 2023-08-20 DIAGNOSIS — D721 Eosinophilia, unspecified: Secondary | ICD-10-CM

## 2023-08-20 DIAGNOSIS — I10 Essential (primary) hypertension: Secondary | ICD-10-CM | POA: Diagnosis not present

## 2023-08-20 LAB — CBC WITH DIFFERENTIAL/PLATELET
Abs Immature Granulocytes: 0 10*3/uL (ref 0.00–0.07)
Band Neutrophils: 1 %
Basophils Absolute: 0.1 10*3/uL (ref 0.0–0.1)
Basophils Relative: 1 %
Eosinophils Absolute: 2.3 10*3/uL — ABNORMAL HIGH (ref 0.0–0.5)
Eosinophils Relative: 22 %
HCT: 39.1 % (ref 36.0–46.0)
Hemoglobin: 13.5 g/dL (ref 12.0–15.0)
Lymphocytes Relative: 30 %
Lymphs Abs: 3.2 10*3/uL (ref 0.7–4.0)
MCH: 33.9 pg (ref 26.0–34.0)
MCHC: 34.5 g/dL (ref 30.0–36.0)
MCV: 98.2 fL (ref 80.0–100.0)
Monocytes Absolute: 0.5 10*3/uL (ref 0.1–1.0)
Monocytes Relative: 5 %
Neutro Abs: 4.5 10*3/uL (ref 1.7–7.7)
Neutrophils Relative %: 41 %
Platelets: 613 10*3/uL — ABNORMAL HIGH (ref 150–400)
RBC: 3.98 MIL/uL (ref 3.87–5.11)
RDW: 12.7 % (ref 11.5–15.5)
Smear Review: INCREASED
WBC: 10.6 10*3/uL — ABNORMAL HIGH (ref 4.0–10.5)
nRBC: 0 % (ref 0.0–0.2)

## 2023-08-20 LAB — LACTATE DEHYDROGENASE: LDH: 176 U/L (ref 98–192)

## 2023-08-21 ENCOUNTER — Inpatient Hospital Stay: Payer: Federal, State, Local not specified - PPO

## 2023-08-27 NOTE — Progress Notes (Unsigned)
 [facility] [address], [place] [address]   CLINIC:  Medical Oncology/Hematology  PCP:  Rica Records, FNP [phone] [address] [place] [address] [phone]   REASON FOR VISIT:  Follow-up for thrombocytosis and eosinophilia  PRIOR THERAPY: None  CURRENT THERAPY: Surveillance   INTERVAL HISTORY:   Sherri Jackson 58 y.o. female returns for routine follow-up of thrombocytosis and eosinophilia.  She was last seen by Rojelio Brenner PA-C on 02/20/2023  At today's visit, she reports feeling fairly well.  She continues to follow with allergist and dermatologist for chronic urticaria/allergies, seasonal allergies, and rhinorrhea.  Symptoms have been more controlled lately with use of Zyrtec twice daily, hydroxyzine, and triamcinolone cream.  Symptoms are worsened by emotional stress and anxiety.  She denies any respiratory symptoms such as shortness of breath, cough, wheezing. She denies any symptoms of esophagitis such as acid reflux/heartburn, dysphagia, nausea, vomiting, abdominal pain.  She denies any constitutional symptoms such as fever, chills, night sweats, unintentional weight loss.  She denies any vasomotor symptoms or history of prior thrombosis.  She continues to smoke 0.5 PPD daily.  She has 100% energy and 100% appetite. She endorses that she is maintaining a stable weight.  ASSESSMENT & PLAN:  1.   Thrombocytosis: - Longstanding history of thrombocytosis with platelet count mostly between 500s and 600s, present since at least 2015.   - Mutational testing NEGATIVE for JAK2 V617F, CALR, MPL, Exons 12-15, and BCR-ABL  - No history of DVT, PE, MI, CVA.   No connective tissue disorder.  Rheumatoid factor and ANA negative.  ESR and CRP were normal. - No aquagenic pruritus or vasomotor symptoms.  No prior history of thrombosis. - She has been diagnosed with idiopathic urticaria and chronic allergies.   - Physical exam does not reveal any  palpable lymphadenopathy or splenomegaly. - Most recent CBC/D (08/20/2023): Platelets 613.  LDH is normal. - Bone marrow biopsy (02/07/2023): Normocellular bone marrow for age with eosinophilic hyperplasia.  Abundant megakaryocytes with scattered large forms.  No significant dyspoiesis or increase in blastic cells.  Overall findings considered nonspecific and nondiagnostic of myeloid neoplasm.  Suspected to be secondary in nature, especially in the absence of clonal abnormalities. - DIFFERENTIAL DIAGNOSIS favors reactive thrombocytosis and leukocytosis from cigarette smoking, steroid use, and allergic disorder - PLAN: Continue periodic surveillance  2.  Eosinophilia - She has had intermittent leukocytosis since 2017. - Persistent elevations in eosinophils since at least 2019 (no prior differentials available).  Neutrophils, lymphocytes, and monocytes have been normal. - No signs of end organ involvement on imaging.  (Reviewed CT CAP from 03/19/2022 which showed small benign lymph nodes in the bilateral axillary regions.  No pathological lymphadenopathy.  No lung nodules.  No splenomegaly.) - Follows with [facility] (Dr. [place] Callas) for idiopathic urticaria, allergic rhinitis, and atopic dermatitis. - No history of asthma, but imaging findings consistent with COPD. - Skin biopsy (04/16/2022) showed "perivascular dermatitis with eosinophils" (moderately dense inflammatory infiltrate in the dermis primarily in the perivascular distribution; infiltrate contains lymphocytes, eosinophils and occasional histiocytes; differential diagnosis from histological standpoint includes florid urticaria, papular urticaria, drug reaction, insect bite reaction, or other arthropod bites." - She uses Kenalog cream for rash. - She does not have any B symptoms or vasomotor symptoms.   - She denies any pulmonary or GI symptoms  - Eosinophilia workup (11/12/2022):  No mutations of CHIC2, PDGFRA, PDGFRB, or FGFR1 observed. Flow  cytometry shows predominance of T  cells with nonspecific changes, but no monoclonal B-cell population identified Mild elevation of vitamin B12 at 1077 (denies any current B12 supplementation) Normal IgE levels, troponin, tryptase, ESR, CRP, EBV DNA, ANA. - Bone marrow biopsy (02/07/2023): Normocellular bone marrow for age with trilineage hematopoiesis, but with increased number of eosinophilic cells.  Significant dyspoiesis or increase in blastic cells is not identified.  Overall findings are not considered specific or diagnostic of a myeloid neoplasm and may be secondary in nature especially in the absence of clonal abnormalities. - Cytogenetics showed normal female karyotype 46,XX[20] - Most recent labs (08/20/2023): WBC 10.6/eosinophils 2.3.  Normal LDH. - Overall findings suggest secondary eosinophilia from chronic urticaria and other allergic type symptoms (rather than primary eosinophilia the cause of the symptoms).  No strong evidence to suggest clonal eosinophilia or MPN at this time. - PLAN: Recommend continued follow-up with allergist.  Patient may consider referral to pulmonologist or gastroenterologist, depending on symptoms.  - We also discussed the importance of addressing underlying anxiety and considering behavioral health counselor/therapist. - We will continue surveillance with repeat CBC/D and office visit in 6 months   3.  Macrocytosis - She has previously had some mild macrocytosis with MCV up to 101.7 - Most recent CBC (08/20/2023) shows normal MCV 98.2 - Previous testing for B12, folic acid, and TSH were normal  4.  Tobacco abuse - Current everyday smoker (0.5 PPD cigarettes x 33 years) - trying to cut back and eventually quit.     PLAN SUMMARY: >> Labs in 6 months = CBC/D, LDH >> OFFICE visit in 6 months      REVIEW OF SYSTEMS:  Review of Systems  Constitutional:  Negative for appetite change, chills, diaphoresis, fatigue, fever and unexpected weight change.  HENT:    Negative for lump/mass and nosebleeds.   Eyes:  Negative for eye problems.  Respiratory:  Negative for cough, hemoptysis and shortness of breath.   Cardiovascular:  Negative for chest pain, leg swelling and palpitations.  Gastrointestinal:  Positive for diarrhea. Negative for abdominal pain, blood in stool, constipation, nausea and vomiting.  Genitourinary:  Negative for hematuria.   Skin:  Positive for itching and rash.  Neurological:  Negative for dizziness, headaches and light-headedness.  Hematological:  Does not bruise/bleed easily.  Psychiatric/Behavioral:  The patient is nervous/anxious.      PHYSICAL EXAM:  ECOG PERFORMANCE STATUS: 1 - Symptomatic but completely ambulatory  Vitals:   08/28/23 0830  BP: (!) 156/82  Pulse: 77  Resp: 18  Temp: 98.1 F (36.7 C)  SpO2: 99%    Filed Weights   08/28/23 0830  Weight: 88 lb (39.9 kg)    Physical Exam Constitutional:      Appearance: Normal appearance. She is underweight.  Cardiovascular:     Heart sounds: Normal heart sounds.  Pulmonary:     Breath sounds: Normal breath sounds.  Neurological:     General: No focal deficit present.     Mental Status: Mental status is at baseline.  Psychiatric:        Behavior: Behavior normal. Behavior is cooperative.     PAST MEDICAL/SURGICAL HISTORY:  Past Medical History:  Diagnosis Date   Elevated MCV 03/20/2016   Will check B12 and folate   Enlarged thyroid 12/01/2013   Hyperlipidemia    Hypertension    Peri-menopausal 12/01/2013   Vaginal Pap smear, abnormal    Vitamin D deficiency 03/20/2016   Past Surgical History:  Procedure Laterality Date   ANKLE SURGERY Left  APPENDECTOMY     BONE MARROW BIOPSY  01/2023   BREAST BIOPSY Right 08/09/2023   MM RT BREAST BX W LOC DEV 1ST LESION IMAGE BX SPEC STEREO GUIDE 08/09/2023 [facility]   HYSTEROSCOPY W/ ENDOMETRIAL ABLATION  07/02/2002   laser conization of cervix  07/02/2002    SOCIAL HISTORY:  Social History    Socioeconomic History   Marital status: Married    Spouse name: Jammie Troup   Number of children: Not on file   Years of education: Not on file   Highest education level: High school graduate  Occupational History   Occupation: Case Event organiser: [employer]  Tobacco Use   Smoking status: Every Day    Current packs/day: 0.50    Average packs/day: 0.5 packs/day for 33.0 years (16.5 ttl pk-yrs)    Types: Cigarettes   Smokeless tobacco: Never  Vaping Use   Vaping status: Never Used  Substance and Sexual Activity   Alcohol use: Yes    Alcohol/week: 1.0 standard drink of alcohol    Types: 1 Glasses of wine per week    Comment: 1 glass of wine a day   Drug use: No   Sexual activity: Yes    Birth control/protection: Post-menopausal  Other Topics Concern   Not on file  Social History Narrative   Not on file   Social Drivers of Health   Financial Resource Strain: Low Risk  (04/08/2023)   Overall Financial Resource Strain (CARDIA)    Difficulty of Paying Living Expenses: Not hard at all  Food Insecurity: No Food Insecurity (04/08/2023)   Hunger Vital Sign    Worried About Running Out of Food in the Last Year: Never true    Ran Out of Food in the Last Year: Never true  Transportation Needs: No Transportation Needs (04/08/2023)   PRAPARE - Administrator, Civil Service (Medical): No    Lack of Transportation (Non-Medical): No  Physical Activity: Sufficiently Active (04/08/2023)   Exercise Vital Sign    Days of Exercise per Week: 7 days    Minutes of Exercise per Session: 30 min  Stress: Stress Concern Present (04/08/2023)   [employer] of Occupational Health - Occupational Stress Questionnaire    Feeling of Stress : To some extent  Social Connections: Moderately Isolated (04/08/2023)   Social Connection and Isolation Panel [NHANES]    Frequency of Communication with Friends and Family: Once a week    Frequency of Social Gatherings with Friends  and Family: Once a week    Attends Religious Services: 1 to 4 times per year    Active Member of [employer] or Organizations: No    Attends Banker Meetings: Never    Marital Status: Married  Catering manager Violence: Not At Risk (03/28/2023)   Humiliation, Afraid, Rape, and Kick questionnaire    Fear of Current or Ex-Partner: No    Emotionally Abused: No    Physically Abused: No    Sexually Abused: No    FAMILY HISTORY:  Family History  Problem Relation Age of Onset   Cancer Father        lung   COPD Father    Emphysema Father    Hyperlipidemia Father    Hypertension Father    Thyroid disease Father    Bone cancer Brother    Diabetes Paternal Uncle    Heart disease Paternal Grandmother    Heart attack Paternal Grandfather    Breast cancer Cousin  Breast cancer Cousin     CURRENT MEDICATIONS:  Outpatient Encounter Medications as of 08/28/2023  Medication Sig   Acetaminophen (TYLENOL PO) Take by mouth.   amLODipine (NORVASC) 2.5 MG tablet Take 1 tablet (2.5 mg total) by mouth daily.   cetirizine (ZYRTEC) 10 MG tablet Take 10 mg by mouth daily.   clobetasol (TEMOVATE) 0.05 % external solution Apply 1 Application topically 2 (two) times daily.   hydrOXYzine (ATARAX) 25 MG tablet Take 25-50 mg by mouth at bedtime.   LORazepam (ATIVAN) 0.5 MG tablet Take 1 tablet (0.5 mg total) by mouth once as needed for up to 1 dose for anxiety.   Multiple Vitamin (MULTIVITAMIN) tablet Take 1 tablet by mouth daily.   triamcinolone cream (KENALOG) 0.1 % Apply 1 application topically 2 (two) times daily.   VITAMIN D, ERGOCALCIFEROL, PO    No facility-administered encounter medications on file as of 08/28/2023.    ALLERGIES:  Allergies  Allergen Reactions   Latex Rash    LABORATORY DATA:  I have reviewed the labs as listed.  CBC    Component Value Date/Time   WBC 10.6 (H) 08/20/2023 1457   RBC 3.98 08/20/2023 1457   HGB 13.5 08/20/2023 1457   HGB 13.5 12/19/2020 1639    HCT 39.1 08/20/2023 1457   HCT 41.2 12/19/2020 1639   PLT 613 (H) 08/20/2023 1457   PLT 583 (H) 12/19/2020 1639   MCV 98.2 08/20/2023 1457   MCV 101 (H) 12/19/2020 1639   MCH 33.9 08/20/2023 1457   MCHC 34.5 08/20/2023 1457   RDW 12.7 08/20/2023 1457   RDW 12.0 12/19/2020 1639   LYMPHSABS 3.2 08/20/2023 1457   MONOABS 0.5 08/20/2023 1457   EOSABS 2.3 (H) 08/20/2023 1457   BASOSABS 0.1 08/20/2023 1457      Latest Ref Rng & Units 03/01/2023    8:23 AM 10/29/2022    2:55 PM 02/28/2022    8:11 AM  CMP  Glucose 70 - 99 mg/dL 86  782  90   BUN 6 - 24 mg/dL 6  10  7    Creatinine 0.57 - 1.00 mg/dL 9.56  2.13  0.86   Sodium 134 - 144 mmol/L 139  132  138   Potassium 3.5 - 5.2 mmol/L 5.1  3.8  4.7   Chloride 96 - 106 mmol/L 100  98  98   CO2 20 - 29 mmol/L 20  24  22    Calcium 8.7 - 10.2 mg/dL 9.9  9.1  57.8   Total Protein 6.0 - 8.5 g/dL 7.1  6.9  7.1   Total Bilirubin 0.0 - 1.2 mg/dL 0.3  0.5  0.3   Alkaline Phos 44 - 121 IU/L 72  60  71   AST 0 - 40 IU/L 33  24  25   ALT 0 - 32 IU/L 20  21  18      DIAGNOSTIC IMAGING:  I have independently reviewed the relevant imaging and discussed with the patient.   WRAP UP:  All questions were answered. The patient knows to call the clinic with any problems, questions or concerns.  Medical decision making: Moderate  Time spent on visit: I spent 20 minutes counseling the patient face to face. The total time spent in the appointment was 30 minutes and more than 50% was on counseling.  Carnella Guadalajara, PA-C  08/28/23 9:12 AM

## 2023-08-28 ENCOUNTER — Inpatient Hospital Stay: Payer: Federal, State, Local not specified - PPO | Admitting: Physician Assistant

## 2023-08-28 DIAGNOSIS — D75839 Thrombocytosis, unspecified: Secondary | ICD-10-CM | POA: Diagnosis not present

## 2023-08-28 DIAGNOSIS — D721 Eosinophilia, unspecified: Secondary | ICD-10-CM

## 2023-08-28 DIAGNOSIS — I10 Essential (primary) hypertension: Secondary | ICD-10-CM | POA: Diagnosis not present

## 2023-08-28 DIAGNOSIS — Z801 Family history of malignant neoplasm of trachea, bronchus and lung: Secondary | ICD-10-CM | POA: Diagnosis not present

## 2023-08-28 DIAGNOSIS — D7589 Other specified diseases of blood and blood-forming organs: Secondary | ICD-10-CM | POA: Diagnosis not present

## 2023-08-28 DIAGNOSIS — R636 Underweight: Secondary | ICD-10-CM | POA: Diagnosis not present

## 2023-08-28 DIAGNOSIS — E785 Hyperlipidemia, unspecified: Secondary | ICD-10-CM | POA: Diagnosis not present

## 2023-08-28 DIAGNOSIS — L501 Idiopathic urticaria: Secondary | ICD-10-CM | POA: Diagnosis not present

## 2023-08-28 DIAGNOSIS — Z79899 Other long term (current) drug therapy: Secondary | ICD-10-CM | POA: Diagnosis not present

## 2023-08-28 DIAGNOSIS — L299 Pruritus, unspecified: Secondary | ICD-10-CM | POA: Diagnosis not present

## 2023-08-28 DIAGNOSIS — L209 Atopic dermatitis, unspecified: Secondary | ICD-10-CM | POA: Diagnosis not present

## 2023-08-28 DIAGNOSIS — J3489 Other specified disorders of nose and nasal sinuses: Secondary | ICD-10-CM | POA: Diagnosis not present

## 2023-08-28 DIAGNOSIS — D72829 Elevated white blood cell count, unspecified: Secondary | ICD-10-CM | POA: Diagnosis not present

## 2023-08-28 DIAGNOSIS — F17210 Nicotine dependence, cigarettes, uncomplicated: Secondary | ICD-10-CM | POA: Diagnosis not present

## 2023-08-28 DIAGNOSIS — R197 Diarrhea, unspecified: Secondary | ICD-10-CM | POA: Diagnosis not present

## 2023-08-28 DIAGNOSIS — Z9049 Acquired absence of other specified parts of digestive tract: Secondary | ICD-10-CM | POA: Diagnosis not present

## 2023-08-28 NOTE — Patient Instructions (Addendum)
 [facility] **VISIT SUMMARY & IMPORTANT INSTRUCTIONS **   You were seen today by Rojelio Brenner PA-C for your elevated platelets and elevated eosinophils.    ELEVATED EOSINOPHILS: Your elevated eosinophils are most likely the result of inflammation from your chronic hives.  Previous testing did not show any evidence of cancerous bone marrow process. Anxiety can worsen some of the symptoms.  Remember to make ALL of your health a priority, including your mental health and wellbeing.  Consider finding a counselor or therapist that can help you to take steps toward holistic wellness.  ELEVATED PLATELETS: Your platelets are most likely reactive from inflammation and smoking.   **PLEASE SEE THE ATTACHED HANDOUT for tips on smoking cessation.  FOLLOW-UP APPOINTMENT: 6 months  ** Thank you for trusting me with your healthcare!  I strive to provide all of my patients with quality care at each visit.  If you receive a survey for this visit, I would be so grateful to you for taking the time to provide feedback.  Thank you in advance!  ~ Mera Gunkel                   Dr. Doreatha Massed   &   Rojelio Brenner, PA-C   - - - - - - - - - - - - - - - - - -    Thank you for choosing [facility] to provide your oncology and hematology care.  To afford each patient quality time with our provider, please arrive at least 15 minutes before your scheduled appointment time.   If you have a lab appointment with the Cancer Center please come in thru the Main Entrance and check in at the main information desk.  You need to re-schedule your appointment should you arrive 10 or more minutes late.  We strive to give you quality time with our providers, and arriving late affects you and other patients whose appointments are after yours.  Also, if you no show three or more times for appointments you may be dismissed from the clinic at the  providers discretion.     Again, thank you for choosing [facility].  Our hope is that these requests will decrease the amount of time that you wait before being seen by our physicians.       _____________________________________________________________  Should you have questions after your visit to [facility], please contact our office at [phone] and follow the prompts.  Our office hours are 8:00 a.m. and 4:30 p.m. Monday - Friday.  Please note that voicemails left after 4:00 p.m. may not be returned until the following business day.  We are closed weekends and major holidays.  You do have access to a nurse 24-7, just call the main number to the clinic [phone] and do not press any options, hold on the line and a nurse will answer the phone.    For prescription refill requests, have your pharmacy contact our office and allow 72 hours.

## 2023-10-25 ENCOUNTER — Other Ambulatory Visit: Payer: Self-pay | Admitting: Family Medicine

## 2023-10-25 DIAGNOSIS — I10 Essential (primary) hypertension: Secondary | ICD-10-CM

## 2023-12-12 ENCOUNTER — Encounter: Payer: Self-pay | Admitting: Internal Medicine

## 2023-12-12 ENCOUNTER — Other Ambulatory Visit: Payer: Self-pay | Admitting: Internal Medicine

## 2023-12-12 ENCOUNTER — Ambulatory Visit: Payer: Self-pay

## 2023-12-12 ENCOUNTER — Ambulatory Visit: Admitting: Internal Medicine

## 2023-12-12 VITALS — BP 139/88 | HR 97 | Ht 59.0 in | Wt 89.6 lb

## 2023-12-12 DIAGNOSIS — M7521 Bicipital tendinitis, right shoulder: Secondary | ICD-10-CM | POA: Diagnosis not present

## 2023-12-12 MED ORDER — NAPROXEN 500 MG PO TABS: 500.0000 mg | ORAL_TABLET | Freq: Two times a day (BID) | ORAL | 0 refills | Status: DC

## 2023-12-12 MED ORDER — PREDNISONE 20 MG PO TABS: 40.0000 mg | ORAL_TABLET | Freq: Every day | ORAL | 0 refills | Status: DC

## 2023-12-12 NOTE — Patient Instructions (Signed)
 Please start taking Prednisone as prescribed.  Please take Naproxen as needed for pain after completing Prednisone.

## 2023-12-12 NOTE — Progress Notes (Signed)
 Acute Office Visit  Subjective:    Patient ID: Sherri Jackson, female    DOB: [birth year], 58 y.o.   MRN: [id number]  Chief Complaint  Patient presents with   Shoulder Pain    Pt reports sx of right shoulder pain started last Friday, worsened in the last few days.     HPI Patient is in today for complaint of right shoulder pain for the last 1 week.  Pain is constant, dull, progressive, radiating towards upper arm and worse with shoulder movement.  She has worsening of pain upon lying on right side.  She has tried taking Tylenol and ibuprofen with mild relief.  She has also tried applying Biofreeze with mild relief.  Denies any numbness or tingling of the UE.  Denies any recent injury.  Past Medical History:  Diagnosis Date   Elevated MCV 03/20/2016   Will check B12 and folate   Enlarged thyroid  12/01/2013   Hyperlipidemia    Hypertension    Peri-menopausal 12/01/2013   Vaginal Pap smear, abnormal    Vitamin D  deficiency 03/20/2016    Past Surgical History:  Procedure Laterality Date   ANKLE SURGERY Left    APPENDECTOMY     BONE MARROW BIOPSY  01/2023   BREAST BIOPSY Right 08/09/2023   MM RT BREAST BX W LOC DEV 1ST LESION IMAGE BX SPEC STEREO GUIDE 08/09/2023 [facility]   HYSTEROSCOPY W/ ENDOMETRIAL ABLATION  07/02/2002   laser conization of cervix  07/02/2002    Family History  Problem Relation Age of Onset   Cancer Father        lung   COPD Father    Emphysema Father    Hyperlipidemia Father    Hypertension Father    Thyroid  disease Father    Bone cancer Brother    Diabetes Paternal Uncle    Heart disease Paternal Grandmother    Heart attack Paternal Grandfather    Breast cancer Cousin    Breast cancer Cousin     Social History   Socioeconomic History   Marital status: Married    Spouse name: Mersadie Kavanaugh   Number of children: Not on file   Years of education: Not on file   Highest education level: High school graduate  Occupational History    Occupation: Case Event organiser: [employer]  Tobacco Use   Smoking status: Every Day    Current packs/day: 0.50    Average packs/day: 0.5 packs/day for 33.0 years (16.5 ttl pk-yrs)    Types: Cigarettes   Smokeless tobacco: Never  Vaping Use   Vaping status: Never Used  Substance and Sexual Activity   Alcohol use: Yes    Alcohol/week: 1.0 standard drink of alcohol    Types: 1 Glasses of wine per week    Comment: 1 glass of wine a day   Drug use: No   Sexual activity: Yes    Birth control/protection: Post-menopausal  Other Topics Concern   Not on file  Social History Narrative   Not on file   Social Drivers of Health   Financial Resource Strain: Low Risk  (04/08/2023)   Overall Financial Resource Strain (CARDIA)    Difficulty of Paying Living Expenses: Not hard at all  Food Insecurity: No Food Insecurity (04/08/2023)   Hunger Vital Sign    Worried About Running Out of Food in the Last Year: Never true    Ran Out of Food in the Last Year: Never true  Transportation Needs: No  Transportation Needs (04/08/2023)   PRAPARE - Administrator, Civil Service (Medical): No    Lack of Transportation (Non-Medical): No  Physical Activity: Sufficiently Active (04/08/2023)   Exercise Vital Sign    Days of Exercise per Week: 7 days    Minutes of Exercise per Session: 30 min  Stress: Stress Concern Present (04/08/2023)   [employer] of Occupational Health - Occupational Stress Questionnaire    Feeling of Stress : To some extent  Social Connections: Moderately Isolated (04/08/2023)   Social Connection and Isolation Panel    Frequency of Communication with Friends and Family: Once a week    Frequency of Social Gatherings with Friends and Family: Once a week    Attends Religious Services: 1 to 4 times per year    Active Member of [employer] or Organizations: No    Attends Banker Meetings: Never    Marital Status: Married  Catering manager Violence: Not At  Risk (03/28/2023)   Humiliation, Afraid, Rape, and Kick questionnaire    Fear of Current or Ex-Partner: No    Emotionally Abused: No    Physically Abused: No    Sexually Abused: No    Outpatient Medications Prior to Visit  Medication Sig Dispense Refill   Acetaminophen (TYLENOL PO) Take by mouth.     amLODipine  (NORVASC ) 2.5 MG tablet Take 1 tablet by mouth daily. 30 tablet 2   cetirizine (ZYRTEC) 10 MG tablet Take 10 mg by mouth daily.     clobetasol (TEMOVATE) 0.05 % external solution Apply 1 Application topically 2 (two) times daily.     hydrOXYzine (ATARAX) 25 MG tablet Take 25-50 mg by mouth at bedtime.     LORazepam  (ATIVAN ) 0.5 MG tablet Take 1 tablet (0.5 mg total) by mouth once as needed for up to 1 dose for anxiety. 20 tablet 0   Multiple Vitamin (MULTIVITAMIN) tablet Take 1 tablet by mouth daily.     triamcinolone cream (KENALOG) 0.1 % Apply 1 application topically 2 (two) times daily.     VITAMIN D , ERGOCALCIFEROL , PO      No facility-administered medications prior to visit.    Allergies  Allergen Reactions   Latex Rash    Review of Systems  Constitutional:  Negative for chills and fever.  HENT:  Negative for congestion and sore throat.   Eyes:  Negative for pain and discharge.  Respiratory:  Negative for cough and shortness of breath.   Cardiovascular:  Negative for chest pain and palpitations.  Gastrointestinal:  Negative for abdominal pain, diarrhea, nausea and vomiting.  Endocrine: Negative for polydipsia and polyuria.  Genitourinary:  Negative for dysuria and hematuria.  Musculoskeletal:  Positive for arthralgias (R shoulder). Negative for neck pain and neck stiffness.  Skin:  Negative for rash.  Neurological:  Negative for dizziness and weakness.  Psychiatric/Behavioral:  Negative for agitation and behavioral problems.        Objective:    Physical Exam Vitals reviewed.  Constitutional:      General: She is not in acute distress.    Appearance: She  is not diaphoretic.  HENT:     Mouth/Throat:     Mouth: Mucous membranes are moist.   Eyes:     General: No scleral icterus.    Extraocular Movements: Extraocular movements intact.    Cardiovascular:     Rate and Rhythm: Normal rate and regular rhythm.     Heart sounds: Normal heart sounds. No murmur heard. Pulmonary:  Breath sounds: Normal breath sounds. No wheezing or rales.   Musculoskeletal:     Right shoulder: Tenderness present. No swelling. Normal range of motion.     Right upper arm: Tenderness present.     Cervical back: Neck supple. No tenderness.     Right lower leg: No edema.     Left lower leg: No edema.   Skin:    General: Skin is warm.     Findings: No rash.   Neurological:     General: No focal deficit present.     Mental Status: She is alert and oriented to person, place, and time.   Psychiatric:        Mood and Affect: Mood normal.        Behavior: Behavior normal.     BP 139/88   Pulse 97   Ht 4' 11 (1.499 m)   Wt 89 lb 9.6 oz (40.6 kg)   LMP 10/01/2013 Comment: bleeding 09/2016  SpO2 99%   BMI 18.10 kg/m  Wt Readings from Last 3 Encounters:  12/12/23 89 lb 9.6 oz (40.6 kg)  08/28/23 88 lb (39.9 kg)  07/19/23 88 lb 0.6 oz (39.9 kg)        Assessment & Plan:   Problem List Items Addressed This Visit       Musculoskeletal and Integument   Biceps tendinitis of right upper extremity - Primary   Has tenderness in upper arm area, pain worse with flexion and abduction Passive ROM intact Likely has biceps tendinitis due to overexertion - lifting Has tried ibuprofen and Biofreeze with mild relief Started prednisone 40 mg once daily x 5 days Can take naproxen as needed for pain after completing prednisone Avoid heavy lifting with RUE If persistent, may need imaging and/or orthopedic surgery evaluation      Relevant Medications   predniSONE (DELTASONE) 20 MG tablet   naproxen (NAPROSYN) 500 MG tablet     Meds ordered this  encounter  Medications   predniSONE (DELTASONE) 20 MG tablet    Sig: Take 2 tablets (40 mg total) by mouth daily with breakfast.    Dispense:  10 tablet    Refill:  0   naproxen (NAPROSYN) 500 MG tablet    Sig: Take 1 tablet (500 mg total) by mouth 2 (two) times daily with a meal.    Dispense:  30 tablet    Refill:  0     Katha Kuehne Alyssa Backbone, MD

## 2023-12-12 NOTE — Telephone Encounter (Signed)
 Patient scheduled.

## 2023-12-12 NOTE — Telephone Encounter (Signed)
 FYI Only or Action Required?: Action required by provider  Patient was last seen in primary care on 07/19/2023 by Del Abron Abt, FNP. Called Nurse Triage reporting Shoulder Pain. Symptoms began a week ago. Interventions attempted: OTC medications:  Sherri Jackson Symptoms are: pain, decreased ROM.gradually worsening.  Triage Disposition: See Physician Within 24 Hours  Patient/caregiver understands and will follow disposition?: Yes          Copied from CRM [id number]. Topic: Clinical - Red Word Triage >> Dec 12, 2023  8:21 AM Sherri Jackson wrote: Red Word that prompted transfer to Nurse Triage: Patient called right shoulder lots of discomfort and pain, has no idea what's he did to it. Would like to come in  as soon as possible. Reason for Disposition  Can't move injured shoulder normally (limited range of motion)  Answer Assessment - Initial Assessment Questions 1. MECHANISM: How did the injury happen?      UNsure 2. WHEN: When did the injury happen? (Minutes or hours ago)      Last Friday 3. LOCATION: Which shoulder is injured?     Right 4. APPEARANCE of INJURY: What does the injury look like?      N/a 5. SEVERITY: Can your child move the shoulder normally? (Can reach up to the sky, out to the side)      Decreased ROM 6. SIZE: For bruises or swelling, ask: How large is it? (Inches or centimeters)      /A 7. PAIN: Is there pain? If so, ask: How bad is the pain?      Throbbing 8. TETANUS: For any breaks in the skin, ask: When was the last tetanus booster?     N/a  Protocols used: Shoulder Injury-Jackson-AH

## 2023-12-12 NOTE — Assessment & Plan Note (Addendum)
 Has tenderness in upper arm area, pain worse with flexion and abduction Passive ROM intact Likely has biceps tendinitis due to overexertion - lifting Has tried ibuprofen and Biofreeze with mild relief Started prednisone 40 mg once daily x 5 days Can take naproxen as needed for pain after completing prednisone Avoid heavy lifting with RUE If persistent, may need imaging and/or orthopedic surgery evaluation

## 2024-01-15 ENCOUNTER — Other Ambulatory Visit: Payer: Self-pay | Admitting: Family Medicine

## 2024-01-16 ENCOUNTER — Ambulatory Visit: Payer: Federal, State, Local not specified - PPO | Admitting: Family Medicine

## 2024-01-16 ENCOUNTER — Encounter: Payer: Self-pay | Admitting: Family Medicine

## 2024-01-16 VITALS — BP 137/80 | HR 73 | Ht 59.0 in | Wt 87.0 lb

## 2024-01-16 DIAGNOSIS — I10 Essential (primary) hypertension: Secondary | ICD-10-CM

## 2024-01-16 DIAGNOSIS — E538 Deficiency of other specified B group vitamins: Secondary | ICD-10-CM | POA: Diagnosis not present

## 2024-01-16 DIAGNOSIS — E038 Other specified hypothyroidism: Secondary | ICD-10-CM | POA: Diagnosis not present

## 2024-01-16 DIAGNOSIS — E559 Vitamin D deficiency, unspecified: Secondary | ICD-10-CM | POA: Diagnosis not present

## 2024-01-16 DIAGNOSIS — R7301 Impaired fasting glucose: Secondary | ICD-10-CM

## 2024-01-16 MED ORDER — LORAZEPAM 0.5 MG PO TABS: 0.5000 mg | ORAL_TABLET | Freq: Once | ORAL | 0 refills | Status: AC | PRN

## 2024-01-16 MED ORDER — AMLODIPINE BESYLATE 2.5 MG PO TABS: 2.5000 mg | ORAL_TABLET | Freq: Every day | ORAL | 4 refills | Status: AC

## 2024-01-16 NOTE — Progress Notes (Signed)
 Established Patient Office Visit   Subjective  Patient ID: Sherri Jackson, female    DOB: Aug 10, 1965  Age: 58 y.o. MRN: [id number]  Chief Complaint  Patient presents with   Hypertension    Six month follow up    She  has a past medical history of Elevated MCV (03/20/2016), Enlarged thyroid  (12/01/2013), Hyperlipidemia, Hypertension, Peri-menopausal (12/01/2013), Vaginal Pap smear, abnormal, and Vitamin D  deficiency (03/20/2016).  HPI Patient presents to the clinic for hypertension follow up. For the details of today's visit, please refer to assessment and plan.   Review of Systems  Constitutional:  Negative for chills and fever.  Eyes:  Negative for blurred vision.  Respiratory:  Negative for shortness of breath.   Cardiovascular:  Negative for chest pain.  Gastrointestinal:  Negative for abdominal pain.  Genitourinary:  Negative for dysuria.  Musculoskeletal:  Negative for falls.  Neurological:  Negative for dizziness and headaches.      Objective:     BP 137/80   Pulse 73   Ht 4' 11 (1.499 m)   Wt 87 lb (39.5 kg)   LMP 10/01/2013 Comment: bleeding 09/2016  SpO2 98%   BMI 17.57 kg/m  BP Readings from Last 3 Encounters:  01/16/24 137/80  12/12/23 139/88  08/28/23 (!) 156/82      Physical Exam Vitals reviewed.  Constitutional:      General: She is not in acute distress.    Appearance: Normal appearance. She is not ill-appearing, toxic-appearing or diaphoretic.  HENT:     Head: Normocephalic.  Eyes:     General:        Right eye: No discharge.        Left eye: No discharge.     Conjunctiva/sclera: Conjunctivae normal.  Cardiovascular:     Rate and Rhythm: Normal rate.     Pulses: Normal pulses.     Heart sounds: Normal heart sounds.  Pulmonary:     Effort: Pulmonary effort is normal. No respiratory distress.     Breath sounds: Normal breath sounds.  Skin:    General: Skin is warm and dry.     Capillary Refill: Capillary refill takes less than 2  seconds.  Neurological:     Mental Status: She is alert.     Coordination: Coordination normal.     Gait: Gait normal.  Psychiatric:        Mood and Affect: Mood normal.        Behavior: Behavior normal.      No results found for any visits on 01/16/24.  The 10-year ASCVD risk score (Arnett DK, et al., 2019) is: 7.5%    Assessment & Plan:  TSH (thyroid -stimulating hormone deficiency) -     TSH + free T4  IFG (impaired fasting glucose) -     Hemoglobin A1c  Vitamin D  deficiency -     VITAMIN D  25 Hydroxy (Vit-D Deficiency, Fractures)  Vitamin B12 deficiency -     Vitamin B12  Primary hypertension Assessment & Plan: Controlled, Continue Amlodipine  2.5 mg once daily Labs ordered. Discussed with  patient to monitor their blood pressure regularly and maintain a heart-healthy diet rich in fruits, vegetables, whole grains, and low-fat dairy, while reducing sodium intake to less than 2,300 mg per day. Regular physical activity, such as 30 minutes of moderate exercise most days of the week, will help lower blood pressure and improve overall cardiovascular health. Avoiding smoking, limiting alcohol consumption, and managing stress. Take  prescribed medication, & take it  as directed and avoid skipping doses. Seek emergency care if your blood pressure is (over 180/100) or you experience chest pain, shortness of breath, or sudden vision changes.Patient verbalizes understanding regarding plan of care and all questions answered.   Orders: -     Basic metabolic panel with GFR -     Lipid panel -     amLODIPine  Besylate; Take 1 tablet (2.5 mg total) by mouth daily.  Dispense: 30 tablet; Refill: 4  Other orders -     LORazepam ; Take 1 tablet (0.5 mg total) by mouth once as needed for up to 1 dose for anxiety.  Dispense: 20 tablet; Refill: 0    Return in about 6 months (around 07/18/2024), or if symptoms worsen or fail to improve, for hypertension.   Hilario Kidd Wilhelmena Falter, FNP

## 2024-01-16 NOTE — Assessment & Plan Note (Signed)
 Controlled, Continue Amlodipine  2.5 mg once daily Labs ordered. Discussed with  patient to monitor their blood pressure regularly and maintain a heart-healthy diet rich in fruits, vegetables, whole grains, and low-fat dairy, while reducing sodium intake to less than 2,300 mg per day. Regular physical activity, such as 30 minutes of moderate exercise most days of the week, will help lower blood pressure and improve overall cardiovascular health. Avoiding smoking, limiting alcohol consumption, and managing stress. Take  prescribed medication, & take it as directed and avoid skipping doses. Seek emergency care if your blood pressure is (over 180/100) or you experience chest pain, shortness of breath, or sudden vision changes.Patient verbalizes understanding regarding plan of care and all questions answered.

## 2024-01-16 NOTE — Patient Instructions (Signed)

## 2024-01-24 DIAGNOSIS — E038 Other specified hypothyroidism: Secondary | ICD-10-CM | POA: Diagnosis not present

## 2024-01-24 DIAGNOSIS — I10 Essential (primary) hypertension: Secondary | ICD-10-CM | POA: Diagnosis not present

## 2024-01-24 DIAGNOSIS — R7301 Impaired fasting glucose: Secondary | ICD-10-CM | POA: Diagnosis not present

## 2024-01-24 DIAGNOSIS — E559 Vitamin D deficiency, unspecified: Secondary | ICD-10-CM | POA: Diagnosis not present

## 2024-01-25 LAB — BASIC METABOLIC PANEL WITH GFR
BUN/Creatinine Ratio: 12 (ref 9–23)
BUN: 7 mg/dL (ref 6–24)
CO2: 21 mmol/L (ref 20–29)
Calcium: 9.8 mg/dL (ref 8.7–10.2)
Chloride: 101 mmol/L (ref 96–106)
Creatinine, Ser: 0.58 mg/dL (ref 0.57–1.00)
Glucose: 86 mg/dL (ref 70–99)
Potassium: 4.4 mmol/L (ref 3.5–5.2)
Sodium: 138 mmol/L (ref 134–144)
eGFR: 105 mL/min/1.73 (ref >59)

## 2024-01-25 LAB — LIPID PANEL
Chol/HDL Ratio: 3.2 ratio (ref 0.0–4.4)
Cholesterol, Total: 204 mg/dL — ABNORMAL HIGH (ref 100–199)
HDL: 63 mg/dL (ref >39)
LDL Chol Calc (NIH): 126 mg/dL — ABNORMAL HIGH (ref 0–99)
Triglycerides: 85 mg/dL (ref 0–149)
VLDL Cholesterol Cal: 15 mg/dL (ref 5–40)

## 2024-01-25 LAB — VITAMIN D 25 HYDROXY (VIT D DEFICIENCY, FRACTURES): Vit D, 25-Hydroxy: 25.4 ng/mL — ABNORMAL LOW (ref 30.0–100.0)

## 2024-01-25 LAB — TSH+FREE T4
Free T4: 1.36 ng/dL (ref 0.82–1.77)
TSH: 0.754 u[IU]/mL (ref 0.450–4.500)

## 2024-01-25 LAB — HEMOGLOBIN A1C
Est. average glucose Bld gHb Est-mCnc: 126 mg/dL
Hgb A1c MFr Bld: 6 % — ABNORMAL HIGH (ref 4.8–5.6)

## 2024-01-25 LAB — VITAMIN B12: Vitamin B-12: 664 pg/mL (ref 232–1245)

## 2024-01-29 ENCOUNTER — Ambulatory Visit: Payer: Self-pay | Admitting: Family Medicine

## 2024-01-29 ENCOUNTER — Other Ambulatory Visit: Payer: Self-pay | Admitting: Family Medicine

## 2024-01-29 MED ORDER — ROSUVASTATIN CALCIUM 5 MG PO TABS: 5.0000 mg | ORAL_TABLET | Freq: Every day | ORAL | 3 refills | Status: AC

## 2024-02-19 ENCOUNTER — Inpatient Hospital Stay: Payer: Federal, State, Local not specified - PPO | Attending: Physician Assistant

## 2024-02-19 DIAGNOSIS — R197 Diarrhea, unspecified: Secondary | ICD-10-CM | POA: Insufficient documentation

## 2024-02-19 DIAGNOSIS — Z825 Family history of asthma and other chronic lower respiratory diseases: Secondary | ICD-10-CM | POA: Diagnosis not present

## 2024-02-19 DIAGNOSIS — Z833 Family history of diabetes mellitus: Secondary | ICD-10-CM | POA: Diagnosis not present

## 2024-02-19 DIAGNOSIS — Z808 Family history of malignant neoplasm of other organs or systems: Secondary | ICD-10-CM | POA: Insufficient documentation

## 2024-02-19 DIAGNOSIS — Z801 Family history of malignant neoplasm of trachea, bronchus and lung: Secondary | ICD-10-CM | POA: Insufficient documentation

## 2024-02-19 DIAGNOSIS — Z8249 Family history of ischemic heart disease and other diseases of the circulatory system: Secondary | ICD-10-CM | POA: Insufficient documentation

## 2024-02-19 DIAGNOSIS — Z83438 Family history of other disorder of lipoprotein metabolism and other lipidemia: Secondary | ICD-10-CM | POA: Diagnosis not present

## 2024-02-19 DIAGNOSIS — D721 Eosinophilia, unspecified: Secondary | ICD-10-CM | POA: Insufficient documentation

## 2024-02-19 DIAGNOSIS — R21 Rash and other nonspecific skin eruption: Secondary | ICD-10-CM | POA: Insufficient documentation

## 2024-02-19 DIAGNOSIS — D75839 Thrombocytosis, unspecified: Secondary | ICD-10-CM | POA: Diagnosis not present

## 2024-02-19 DIAGNOSIS — F419 Anxiety disorder, unspecified: Secondary | ICD-10-CM | POA: Insufficient documentation

## 2024-02-19 DIAGNOSIS — D7589 Other specified diseases of blood and blood-forming organs: Secondary | ICD-10-CM | POA: Diagnosis not present

## 2024-02-19 DIAGNOSIS — F17210 Nicotine dependence, cigarettes, uncomplicated: Secondary | ICD-10-CM | POA: Diagnosis not present

## 2024-02-19 DIAGNOSIS — Z803 Family history of malignant neoplasm of breast: Secondary | ICD-10-CM | POA: Insufficient documentation

## 2024-02-19 DIAGNOSIS — Z79899 Other long term (current) drug therapy: Secondary | ICD-10-CM | POA: Diagnosis not present

## 2024-02-19 LAB — CBC WITH DIFFERENTIAL/PLATELET
Abs Immature Granulocytes: 0.04 K/uL (ref 0.00–0.07)
Basophils Absolute: 0.1 K/uL (ref 0.0–0.1)
Basophils Relative: 1 %
Eosinophils Absolute: 1.4 K/uL — ABNORMAL HIGH (ref 0.0–0.5)
Eosinophils Relative: 11 %
HCT: 39.2 % (ref 36.0–46.0)
Hemoglobin: 13.4 g/dL (ref 12.0–15.0)
Immature Granulocytes: 0 %
Lymphocytes Relative: 30 %
Lymphs Abs: 3.7 K/uL (ref 0.7–4.0)
MCH: 32.9 pg (ref 26.0–34.0)
MCHC: 34.2 g/dL (ref 30.0–36.0)
MCV: 96.3 fL (ref 80.0–100.0)
Monocytes Absolute: 0.7 K/uL (ref 0.1–1.0)
Monocytes Relative: 6 %
Neutro Abs: 6.3 K/uL (ref 1.7–7.7)
Neutrophils Relative %: 52 %
Platelets: 602 K/uL — ABNORMAL HIGH (ref 150–400)
RBC: 4.07 MIL/uL (ref 3.87–5.11)
RDW: 12.8 % (ref 11.5–15.5)
WBC: 12.2 K/uL — ABNORMAL HIGH (ref 4.0–10.5)
nRBC: 0 % (ref 0.0–0.2)

## 2024-02-19 LAB — LACTATE DEHYDROGENASE: LDH: 165 U/L (ref 98–192)

## 2024-02-20 ENCOUNTER — Ambulatory Visit: Payer: Self-pay

## 2024-02-20 NOTE — Telephone Encounter (Signed)
 Called patient,next opening Tuesday 8/26 patient will follow up with urgent care

## 2024-02-20 NOTE — Telephone Encounter (Signed)
 FYI Only or Action Required?: FYI only for provider.  Patient was last seen in primary care on 01/16/2024 by Terry Wilhelmena Lloyd Hilario, FNP.  Called Nurse Triage reporting Nasal Congestion.  Symptoms began a week ago.  Symptoms are: gradually worsening.  Triage Disposition: See Physician Within 24 Hours  Patient/caregiver understands and will follow disposition?: No appointment, going to urgent care.        Copied from CRM #[id number]. Topic: Clinical - Red Word Triage >> Feb 20, 2024 10:19 AM Willma SAUNDERS wrote: [facility] that prompted transfer to Nurse Triage: Patient states she thinks she has a sinus infection, symptoms for over 1 week. Symptoms include a productive cough, a lot of mucous and pain in both ears.         Reason for Disposition  Earache  Answer Assessment - Initial Assessment Questions 1. LOCATION: Where does it hurt?      Nasal passages  2. ONSET: When did the sinus pain start?  (e.g., hours, days)      1 week 3. SEVERITY: How bad is the pain?   (Scale 0-10; or none, mild, moderate or severe)     Mild to moderate  4. RECURRENT SYMPTOM: Have you ever had sinus problems before? If Yes, ask: When was the last time? and What happened that time?      Yes, but not recently  5. NASAL CONGESTION: Is the nose blocked? If Yes, ask: Can you open it or must you breathe through your mouth?     Yes 6. NASAL DISCHARGE: Do you have discharge from your nose? If so ask, What color?     Clear, yellow, and green  7. FEVER: Do you have a fever? If Yes, ask: What is it, how was it measured, and when did it start?      No 8. OTHER SYMPTOMS: Do you have any other symptoms? (e.g., sore throat, cough, earache, difficulty breathing)     Cough, bilateral ear pain when blowing nose  Protocols used: Sinus Pain or Congestion-A-AH

## 2024-02-22 ENCOUNTER — Ambulatory Visit
Admission: EM | Admit: 2024-02-22 | Discharge: 2024-02-22 | Disposition: A | Discharge status: Home / Self Care | Attending: Family Medicine | Admitting: Family Medicine

## 2024-02-22 DIAGNOSIS — L03113 Cellulitis of right upper limb: Secondary | ICD-10-CM | POA: Diagnosis not present

## 2024-02-22 DIAGNOSIS — W5501XA Bitten by cat, initial encounter: Secondary | ICD-10-CM | POA: Diagnosis not present

## 2024-02-22 DIAGNOSIS — J309 Allergic rhinitis, unspecified: Secondary | ICD-10-CM

## 2024-02-22 MED ORDER — BACITRACIN 500 UNIT/GM EX OINT
1.0000 | TOPICAL_OINTMENT | Freq: Two times a day (BID) | CUTANEOUS | Status: DC
  Administered 2024-02-22: 1 via TOPICAL

## 2024-02-22 MED ORDER — CHLORHEXIDINE GLUCONATE 4 % EX SOLN: Freq: Every day | CUTANEOUS | 0 refills | Status: AC | PRN

## 2024-02-22 MED ORDER — AZELASTINE HCL 0.1 % NA SOLN: 1.0000 | Freq: Two times a day (BID) | NASAL | 2 refills | Status: AC

## 2024-02-22 MED ORDER — MUPIROCIN 2 % EX OINT: 1.0000 | TOPICAL_OINTMENT | Freq: Two times a day (BID) | CUTANEOUS | 0 refills | Status: DC

## 2024-02-22 MED ORDER — CETIRIZINE HCL 10 MG PO TABS: 10.0000 mg | ORAL_TABLET | Freq: Every day | ORAL | 2 refills | Status: AC

## 2024-02-22 MED ORDER — AMOXICILLIN-POT CLAVULANATE 875-125 MG PO TABS: 1.0000 | ORAL_TABLET | Freq: Two times a day (BID) | ORAL | 0 refills | Status: DC

## 2024-02-22 NOTE — Discharge Instructions (Addendum)
 Clean the area of the cat bite once to twice daily with the Hibiclens  solution and apply the mupirocin  ointment and a nonstick dressing.  Do this until it is fully healed.  Elevate the arm at rest to help with swelling, ice off-and-on.  Take the full course of Augmentin  and follow-up in 1 to 2 days if not beginning to improve or anytime that it may be worsening.  Regarding your sinus symptoms, suspect related to uncontrolled seasonal allergies.  Start Zyrtec  and Astelin  regimen daily, may do Coricidin HBP, plain Mucinex, saline sinus rinses once to twice daily

## 2024-02-22 NOTE — ED Triage Notes (Addendum)
 Pt reports cat bite to the right forearm occurred yesterday, swelling and redness present at the sight of the bite. Pt states the cat is hers and is up to date on her shots.

## 2024-02-22 NOTE — ED Provider Notes (Signed)
 [facility]    CSN: [id number] Arrival date & time: 02/22/24  9166      History   Chief Complaint No chief complaint on file.   HPI Sherri Jackson is a 58 y.o. female.   Patient presenting today with 1 day history of cat bite to the right wrist/forearm.  States this occurred yesterday morning and the area did not bother her all day yesterday after cleaning it with soap and water but last night and into this morning the area is significantly swollen, red, tender, warm and she has streaking up toward her elbow.  Denies fever, chills, loss of range of motion, numbness, tingling.  States cat is up-to-date on rabies vaccine, it was her own personal cat.  Her last tetanus shot was in 2024 per chart review.  She is also complaining of about a week of nasal congestion, sinus pressure, scratchy throat, productive cough.  So far not trying anything over-the-counter for the symptoms.  Has a history of seasonal allergies and previously was on an allergy regimen but has not been on these medications in quite some time.    Past Medical History:  Diagnosis Date   Elevated MCV 03/20/2016   Will check B12 and folate   Enlarged thyroid  12/01/2013   Hyperlipidemia    Hypertension    Peri-menopausal 12/01/2013   Vaginal Pap smear, abnormal    Vitamin D  deficiency 03/20/2016    Patient Active Problem List   Diagnosis Date Noted   Biceps tendinitis of right upper extremity 12/12/2023   Hypertension 04/09/2023   Elevated BP without diagnosis of hypertension 03/28/2023   Elevated blood pressure reading 02/26/2023   Elevated cholesterol with elevated triglycerides 12/19/2020   Encounter for screening fecal occult blood testing 12/19/2020   Encounter for gynecological examination with Papanicolaou smear of cervix 11/17/2019   Anxiety 11/17/2019   Thrombocytosis 03/30/2019   Encounter for well woman exam with routine gynecological exam 05/21/2018   Screening for colorectal cancer  05/21/2018   Encounter for vitamin deficiency screening 05/21/2018   Screening cholesterol level 05/21/2018   PMB (postmenopausal bleeding) 04/08/2017   Well woman exam with routine gynecological exam 04/08/2017   Irritable bowel syndrome with diarrhea 04/08/2017   Vitamin D  deficiency 03/20/2016   Elevated MCV 03/20/2016   Postmenopausal 03/19/2016   Hot flashes 03/19/2016   Hemorrhoid 03/19/2016   Stress and adjustment reaction 03/19/2016   Peri-menopausal 12/01/2013   Enlarged thyroid  12/01/2013    Past Surgical History:  Procedure Laterality Date   ANKLE SURGERY Left    APPENDECTOMY     BONE MARROW BIOPSY  01/2023   BREAST BIOPSY Right 08/09/2023   MM RT BREAST BX W LOC DEV 1ST LESION IMAGE BX SPEC STEREO GUIDE 08/09/2023 [facility]   HYSTEROSCOPY W/ ENDOMETRIAL ABLATION  07/02/2002   laser conization of cervix  07/02/2002    OB History     Gravida  1   Para      Term      Preterm      AB  1   Living  0      SAB  1   IAB      Ectopic      Multiple      Live Births           Obstetric Comments  Pt reports having 1 miscarriage. She was in her 40's.          Home Medications    Prior to Admission medications  Medication Sig Start Date End Date Taking? Authorizing Provider  amoxicillin -clavulanate (AUGMENTIN ) 875-125 MG tablet Take 1 tablet by mouth every 12 (twelve) hours. 02/22/24  Yes Stuart Vernell Norris, PA-C  azelastine  (ASTELIN ) 0.1 % nasal spray Place 1 spray into both nostrils 2 (two) times daily. Use in each nostril as directed 02/22/24  Yes Stuart Vernell Norris, PA-C  cetirizine  (ZYRTEC  ALLERGY) 10 MG tablet Take 1 tablet (10 mg total) by mouth daily. 02/22/24  Yes Stuart Vernell Norris, PA-C  chlorhexidine  (HIBICLENS ) 4 % external liquid Apply topically daily as needed. 02/22/24  Yes Stuart Vernell Norris, PA-C  mupirocin  ointment (BACTROBAN ) 2 % Apply 1 Application topically 2 (two) times daily. 02/22/24  Yes Stuart Vernell Norris, PA-C  Acetaminophen (TYLENOL PO) Take by mouth.    [provider]  amLODipine  (NORVASC ) 2.5 MG tablet Take 1 tablet (2.5 mg total) by mouth daily. 01/16/24   Del Orbe Polanco, Iliana, FNP  cetirizine  (ZYRTEC ) 10 MG tablet Take 10 mg by mouth daily.    [provider]  clobetasol (TEMOVATE) 0.05 % external solution Apply 1 Application topically 2 (two) times daily.    [provider]  hydrOXYzine (ATARAX) 25 MG tablet Take 25-50 mg by mouth at bedtime. 09/22/22   [provider]  LORazepam  (ATIVAN ) 0.5 MG tablet Take 1 tablet (0.5 mg total) by mouth once as needed for up to 1 dose for anxiety. 01/16/24   Del Orbe Polanco, Iliana, FNP  Multiple Vitamin (MULTIVITAMIN) tablet Take 1 tablet by mouth daily.    [provider]  naproxen  (NAPROSYN ) 500 MG tablet Take 1 tablet (500 mg total) by mouth 2 (two) times daily with a meal. 12/12/23   Tobie Suzzane POUR, MD  predniSONE  (DELTASONE ) 20 MG tablet Take 2 tablets (40 mg total) by mouth daily with breakfast. 12/12/23   Tobie Suzzane POUR, MD  rosuvastatin  (CRESTOR ) 5 MG tablet Take 1 tablet (5 mg total) by mouth daily. 01/29/24   Del Orbe Polanco, Iliana, FNP  triamcinolone cream (KENALOG) 0.1 % Apply 1 application topically 2 (two) times daily. 03/19/19   [provider]  VITAMIN D , ERGOCALCIFEROL , PO     [provider]    Family History Family History  Problem Relation Age of Onset   Cancer Father        lung   COPD Father    Emphysema Father    Hyperlipidemia Father    Hypertension Father    Thyroid  disease Father    Bone cancer Brother    Diabetes Paternal Uncle    Heart disease Paternal Grandmother    Heart attack Paternal Grandfather    Breast cancer Cousin    Breast cancer Cousin     Social History Social History   Tobacco Use   Smoking status: Every Day    Current packs/day: 0.50    Average packs/day: 0.5 packs/day for 33.0 years (16.5 ttl pk-yrs)    Types:  Cigarettes   Smokeless tobacco: Never  Vaping Use   Vaping status: Never Used  Substance Use Topics   Alcohol use: Yes    Alcohol/week: 1.0 standard drink of alcohol    Types: 1 Glasses of wine per week    Comment: 1 glass of wine a day   Drug use: No     Allergies   Latex   Review of Systems Review of Systems Per HPI  Physical Exam Triage Vital Signs ED Triage Vitals  Encounter Vitals Group     BP 02/22/24  0844 (!) 157/83     Girls Systolic BP Percentile --      Girls Diastolic BP Percentile --      Boys Systolic BP Percentile --      Boys Diastolic BP Percentile --      Pulse Rate 02/22/24 0844 62     Resp 02/22/24 0844 20     Temp 02/22/24 0844 98 F (36.7 C)     Temp Source 02/22/24 0844 Oral     SpO2 02/22/24 0844 95 %     Weight --      Height --      Head Circumference --      Peak Flow --      Pain Score 02/22/24 0846 5     Pain Loc --      Pain Education --      Exclude from Growth Chart --    No data found.  Updated Vital Signs BP (!) 157/83 (BP Location: Right Arm)   Pulse 62   Temp 98 F (36.7 C) (Oral)   Resp 20   LMP 10/01/2013 Comment: bleeding 09/2016  SpO2 95%   Visual Acuity Right Eye Distance:   Left Eye Distance:   Bilateral Distance:    Right Eye Near:   Left Eye Near:    Bilateral Near:     Physical Exam Vitals and nursing note reviewed.  Constitutional:      Appearance: Normal appearance.  HENT:     Head: Atraumatic.     Right Ear: Tympanic membrane and external ear normal.     Left Ear: Tympanic membrane and external ear normal.     Nose: Rhinorrhea present.     Mouth/Throat:     Mouth: Mucous membranes are moist.     Pharynx: Posterior oropharyngeal erythema present.  Eyes:     Extraocular Movements: Extraocular movements intact.     Conjunctiva/sclera: Conjunctivae normal.  Cardiovascular:     Rate and Rhythm: Normal rate and regular rhythm.     Heart sounds: Normal heart sounds.  Pulmonary:     Effort:  Pulmonary effort is normal.     Breath sounds: Normal breath sounds. No wheezing or rales.  Musculoskeletal:        General: Swelling, tenderness and signs of injury present. No deformity. Normal range of motion.     Cervical back: Normal range of motion and neck supple.  Skin:    General: Skin is warm and dry.     Findings: Erythema present.     Comments: Significant erythema, edema, tenderness to palpation to the right wrist and forearm surrounding the puncture wound from cat bite.  Slight streaking trending up toward elbow  Neurological:     Mental Status: She is alert and oriented to person, place, and time.     Comments: Right upper extremity neurovascularly intact  Psychiatric:        Mood and Affect: Mood normal.        Thought Content: Thought content normal.      UC Treatments / Results  Labs (all labs ordered are listed, but only abnormal results are displayed) Labs Reviewed - No data to display  EKG   Radiology No results found.  Procedures Procedures (including critical care time)  Medications Ordered in UC Medications  bacitracin  ointment 1 Application (1 Application Topical Given 02/22/24 0926)    Initial Impression / Assessment and Plan / UC Course  I have reviewed the triage vital signs and the nursing  notes.  Pertinent labs & imaging results that were available during my care of the patient were reviewed by me and considered in my medical decision making (see chart for details).     Will start Augmentin , Hibiclens  and mupirocin  with good home wound care for the cat bite.  Up-to-date on tetanus shot, cat up-to-date on rabies.  Regarding her suspected uncontrolled seasonal allergies causing her sinus symptoms, will start Zyrtec  and Astelin  regimen, saline sinus rinses, Coricidin HBP as needed.  Return for worsening symptoms.  Final Clinical Impressions(s) / UC Diagnoses   Final diagnoses:  Right arm cellulitis  Cat bite, initial encounter  Allergic  sinusitis     Discharge Instructions      Clean the area of the cat bite once to twice daily with the Hibiclens  solution and apply the mupirocin  ointment and a nonstick dressing.  Do this until it is fully healed.  Elevate the arm at rest to help with swelling, ice off-and-on.  Take the full course of Augmentin  and follow-up in 1 to 2 days if not beginning to improve or anytime that it may be worsening.  Regarding your sinus symptoms, suspect related to uncontrolled seasonal allergies.  Start Zyrtec  and Astelin  regimen daily, may do Coricidin HBP, plain Mucinex, saline sinus rinses once to twice daily    ED Prescriptions     Medication Sig Dispense Auth. Provider   amoxicillin -clavulanate (AUGMENTIN ) 875-125 MG tablet Take 1 tablet by mouth every 12 (twelve) hours. 14 tablet Stuart Vernell Norris, PA-C   azelastine  (ASTELIN ) 0.1 % nasal spray Place 1 spray into both nostrils 2 (two) times daily. Use in each nostril as directed 30 mL Stuart Vernell Norris, PA-C   cetirizine  (ZYRTEC  ALLERGY) 10 MG tablet Take 1 tablet (10 mg total) by mouth daily. 30 tablet Stuart Vernell Norris, PA-C   chlorhexidine  (HIBICLENS ) 4 % external liquid Apply topically daily as needed. 236 mL Stuart Vernell Norris, PA-C   mupirocin  ointment (BACTROBAN ) 2 % Apply 1 Application topically 2 (two) times daily. 60 g Stuart Vernell Norris, [place]      PDMP not reviewed this encounter.   Stuart Vernell [place], [place] 02/22/24 [phone]

## 2024-02-24 ENCOUNTER — Inpatient Hospital Stay: Admitting: Physician Assistant

## 2024-02-26 ENCOUNTER — Inpatient Hospital Stay: Payer: Federal, State, Local not specified - PPO | Admitting: Physician Assistant

## 2024-03-04 ENCOUNTER — Ambulatory Visit: Payer: Self-pay

## 2024-03-04 ENCOUNTER — Encounter: Payer: Self-pay | Admitting: Internal Medicine

## 2024-03-04 ENCOUNTER — Ambulatory Visit: Admitting: Internal Medicine

## 2024-03-04 VITALS — BP 131/79 | HR 112 | Ht 59.0 in | Wt 85.4 lb

## 2024-03-04 DIAGNOSIS — W5501XD Bitten by cat, subsequent encounter: Secondary | ICD-10-CM | POA: Diagnosis not present

## 2024-03-04 DIAGNOSIS — W5501XA Bitten by cat, initial encounter: Secondary | ICD-10-CM | POA: Insufficient documentation

## 2024-03-04 DIAGNOSIS — L03113 Cellulitis of right upper limb: Secondary | ICD-10-CM

## 2024-03-04 NOTE — Assessment & Plan Note (Addendum)
 Urgent care visit note reviewed Has been treated with oral Augmentin  Cellulitis has resolved now Advised to keep area clean and dry for now

## 2024-03-04 NOTE — Telephone Encounter (Signed)
 FYI Only or Action Required?: FYI only for provider.  Patient was last seen in primary care on 01/16/2024 by Terry Wilhelmena Lloyd Hilario, FNP.  Called Nurse Triage reporting Animal Bite.  Symptoms began several weeks ago.  Interventions attempted: Nothing.  Symptoms are: unchanged.  Triage Disposition: See Physician Within 24 Hours  Patient/caregiver understands and will follow disposition?: Yes   Copied from CRM #[id number]. Topic: Clinical - Red Word Triage >> Mar 04, 2024  9:11 AM Larissa RAMAN wrote: [facility] that prompted transfer to Nurse Triage: cat bite on 8/23- worsening, RT wrist Reason for Disposition  [1] No bite mark or scratch AND [2] suspected bat exposure (e.g., bat found in same room as sleeping adult)  Answer Assessment - Initial Assessment Questions 1. APPEARANCE What does it look like?  (e.g., abrasion, bruise, puncture)      Red and swollen 2. SIZE: How big is the bite? (e.g., inches, cm; or compare to size of coin, pea, grape, ping pong ball)      closed 3. LOCATION: Where is the bite located?      Right wrist on inside 4. ONSET: When did the bite happen? (e.g., minutes, hours ago)      02/21/24 was seen in UC, antibiotics finished last friday 5. ANIMAL: What type of animal caused the bite? Is the injury from a bite or a claw? If the animal is a dog or a cat, ask: Was it a pet or a stray? Was it acting ill or behaving strangely?     cat 6. RABIES VACCINE: For dog or cat bites, ask: Do you know if the pet is vaccinated against rabies?  (e.g., yes, no, overdue for rabies shot, unknown)     yes 7. CIRCUMSTANCES: Tell me how this happened.      Trying to move her 8. TETANUS: When was your last tetanus booster?     Had one last year 9. PREGNANCY: Is there any chance you are pregnant? When was your last menstrual period?     na  Protocols used: Animal Bite-A-AH

## 2024-03-04 NOTE — Patient Instructions (Signed)
 Please apply Mupirocin  ointment over the site.

## 2024-03-04 NOTE — Progress Notes (Signed)
 Acute Office Visit  Subjective:    Patient ID: Sherri Jackson, female    DOB: [birth year], 58 y.o.   MRN: [id number]  Chief Complaint  Patient presents with   Animal Bite    Pt reports area is still red and a little bruising, took antibiotics as treatment from urgent care     HPI Patient is in today for complaint of redness over right forearm area, where she had a cat bite in the last week.  She went to urgent care, was given oral Augmentin , which she has completed.  She has noticed significant improvement in local erythema and swelling.  Denies any fever or chills.  She wants to make sure that she does not have residual infection.  Past Medical History:  Diagnosis Date   Elevated MCV 03/20/2016   Will check B12 and folate   Enlarged thyroid  12/01/2013   Hyperlipidemia    Hypertension    Peri-menopausal 12/01/2013   Vaginal Pap smear, abnormal    Vitamin D  deficiency 03/20/2016    Past Surgical History:  Procedure Laterality Date   ANKLE SURGERY Left    APPENDECTOMY     BONE MARROW BIOPSY  01/2023   BREAST BIOPSY Right 08/09/2023   MM RT BREAST BX W LOC DEV 1ST LESION IMAGE BX SPEC STEREO GUIDE 08/09/2023 [facility]   HYSTEROSCOPY W/ ENDOMETRIAL ABLATION  07/02/2002   laser conization of cervix  07/02/2002    Family History  Problem Relation Age of Onset   Cancer Father        lung   COPD Father    Emphysema Father    Hyperlipidemia Father    Hypertension Father    Thyroid  disease Father    Bone cancer Brother    Diabetes Paternal Uncle    Heart disease Paternal Grandmother    Heart attack Paternal Grandfather    Breast cancer Cousin    Breast cancer Cousin     Social History   Socioeconomic History   Marital status: Married    Spouse name: Natoya Viscomi   Number of children: Not on file   Years of education: Not on file   Highest education level: High school graduate  Occupational History   Occupation: Case Event organiser: [employer]  Tobacco Use   Smoking status: Every Day    Current packs/day: 0.50    Average packs/day: 0.5 packs/day for 33.0 years (16.5 ttl pk-yrs)    Types: Cigarettes   Smokeless tobacco: Never  Vaping Use   Vaping status: Never Used  Substance and Sexual Activity   Alcohol use: Yes    Alcohol/week: 1.0 standard drink of alcohol    Types: 1 Glasses of wine per week    Comment: 1 glass of wine a day   Drug use: No   Sexual activity: Yes    Birth control/protection: Post-menopausal  Other Topics Concern   Not on file  Social History Narrative   Not on file   Social Drivers of Health   Financial Resource Strain: Low Risk  (04/08/2023)   Overall Financial Resource Strain (CARDIA)    Difficulty of Paying Living Expenses: Not hard at all  Food Insecurity: No Food Insecurity (04/08/2023)   Hunger Vital Sign    Worried About Running Out of Food in the Last Year: Never true    Ran Out of Food in the Last Year: Never true  Transportation Needs: No Transportation Needs (04/08/2023)   PRAPARE - Transportation  Lack of Transportation (Medical): No    Lack of Transportation (Non-Medical): No  Physical Activity: Sufficiently Active (04/08/2023)   Exercise Vital Sign    Days of Exercise per Week: 7 days    Minutes of Exercise per Session: 30 min  Stress: Stress Concern Present (04/08/2023)   [employer] of Occupational Health - Occupational Stress Questionnaire    Feeling of Stress : To some extent  Social Connections: Moderately Isolated (04/08/2023)   Social Connection and Isolation Panel    Frequency of Communication with Friends and Family: Once a week    Frequency of Social Gatherings with Friends and Family: Once a week    Attends Religious Services: 1 to 4 times per year    Active Member of [employer] or Organizations: No    Attends Banker Meetings: Never    Marital Status: Married  Catering manager Violence: Not At Risk (03/28/2023)   Humiliation, Afraid, Rape, and  Kick questionnaire    Fear of Current or Ex-Partner: No    Emotionally Abused: No    Physically Abused: No    Sexually Abused: No    Outpatient Medications Prior to Visit  Medication Sig Dispense Refill   Acetaminophen (TYLENOL PO) Take by mouth.     amLODipine  (NORVASC ) 2.5 MG tablet Take 1 tablet (2.5 mg total) by mouth daily. 30 tablet 4   azelastine  (ASTELIN ) 0.1 % nasal spray Place 1 spray into both nostrils 2 (two) times daily. Use in each nostril as directed 30 mL 2   cetirizine  (ZYRTEC  ALLERGY) 10 MG tablet Take 1 tablet (10 mg total) by mouth daily. 30 tablet 2   cetirizine  (ZYRTEC ) 10 MG tablet Take 10 mg by mouth daily.     chlorhexidine  (HIBICLENS ) 4 % external liquid Apply topically daily as needed. 236 mL 0   clobetasol (TEMOVATE) 0.05 % external solution Apply 1 Application topically 2 (two) times daily.     hydrOXYzine (ATARAX) 25 MG tablet Take 25-50 mg by mouth at bedtime.     LORazepam  (ATIVAN ) 0.5 MG tablet Take 1 tablet (0.5 mg total) by mouth once as needed for up to 1 dose for anxiety. 20 tablet 0   Multiple Vitamin (MULTIVITAMIN) tablet Take 1 tablet by mouth daily.     mupirocin  ointment (BACTROBAN ) 2 % Apply 1 Application topically 2 (two) times daily. 60 g 0   naproxen  (NAPROSYN ) 500 MG tablet Take 1 tablet (500 mg total) by mouth 2 (two) times daily with a meal. 30 tablet 0   rosuvastatin  (CRESTOR ) 5 MG tablet Take 1 tablet (5 mg total) by mouth daily. 90 tablet 3   triamcinolone cream (KENALOG) 0.1 % Apply 1 application topically 2 (two) times daily.     VITAMIN D , ERGOCALCIFEROL , PO      amoxicillin -clavulanate (AUGMENTIN ) 875-125 MG tablet Take 1 tablet by mouth every 12 (twelve) hours. 14 tablet 0   predniSONE  (DELTASONE ) 20 MG tablet Take 2 tablets (40 mg total) by mouth daily with breakfast. 10 tablet 0   No facility-administered medications prior to visit.    Allergies  Allergen Reactions   Latex Rash    Review of Systems  Constitutional:   Negative for chills and fever.  HENT:  Negative for congestion and sore throat.   Eyes:  Negative for pain and discharge.  Respiratory:  Negative for cough and shortness of breath.   Cardiovascular:  Negative for chest pain and palpitations.  Gastrointestinal:  Negative for abdominal pain, diarrhea, nausea and vomiting.  Endocrine: Negative for polydipsia and polyuria.  Genitourinary:  Negative for dysuria and hematuria.  Musculoskeletal:  Negative for neck pain and neck stiffness.  Skin:  Negative for rash.  Neurological:  Negative for dizziness and weakness.  Psychiatric/Behavioral:  Negative for agitation and behavioral problems.        Objective:    Physical Exam Vitals reviewed.  Constitutional:      General: She is not in acute distress.    Appearance: She is not diaphoretic.  HENT:     Head: Normocephalic and atraumatic.     Nose: Nose normal.     Mouth/Throat:     Mouth: Mucous membranes are moist.  Eyes:     General: No scleral icterus.    Extraocular Movements: Extraocular movements intact.  Cardiovascular:     Rate and Rhythm: Normal rate and regular rhythm.     Heart sounds: Normal heart sounds. No murmur heard. Pulmonary:     Breath sounds: Normal breath sounds. No wheezing or rales.  Musculoskeletal:     Cervical back: Neck supple. No tenderness.     Right lower leg: No edema.     Left lower leg: No edema.  Skin:    General: Skin is warm.     Findings: No rash.     Comments: Small bite mark over right forearm near radial styloid process, overall healed adequately  Neurological:     General: No focal deficit present.     Mental Status: She is alert and oriented to person, place, and time.  Psychiatric:        Mood and Affect: Mood normal.        Behavior: Behavior normal.     BP 131/79   Pulse (!) 112   Ht 4' 11 (1.499 m)   Wt 85 lb 6.4 oz (38.7 kg)   LMP 10/01/2013 Comment: bleeding 09/2016  SpO2 99%   BMI 17.25 kg/m  Wt Readings from Last 3  Encounters:  03/04/24 85 lb 6.4 oz (38.7 kg)  01/16/24 87 lb (39.5 kg)  12/12/23 89 lb 9.6 oz (40.6 kg)        Assessment & Plan:   Problem List Items Addressed This Visit       Other   Right arm cellulitis - Primary   Urgent care visit note reviewed Has been treated with oral Augmentin  Cellulitis has resolved now Advised to keep area clean and dry for now      Cat bite   Has completed oral Augmentin  Mupirocin  for local bacterial PPx Up-to-date with Tdap vaccination Her cat is vaccinated against rabies, patient does not need rabies immunoglobulin or vaccine        No orders of the defined types were placed in this encounter.    Suzzane MARLA Blanch, MD

## 2024-03-04 NOTE — Assessment & Plan Note (Addendum)
 Has completed oral Augmentin  Mupirocin  for local bacterial PPx Up-to-date with Tdap vaccination Her cat is vaccinated against rabies, patient does not need rabies immunoglobulin or vaccine

## 2024-03-13 ENCOUNTER — Inpatient Hospital Stay: Attending: Oncology | Admitting: Oncology

## 2024-03-13 VITALS — BP 135/79 | HR 72 | Temp 98.0°F | Resp 18 | Wt 85.8 lb

## 2024-03-13 DIAGNOSIS — D7219 Other eosinophilia: Secondary | ICD-10-CM

## 2024-03-13 DIAGNOSIS — D75839 Thrombocytosis, unspecified: Secondary | ICD-10-CM | POA: Insufficient documentation

## 2024-03-13 DIAGNOSIS — D721 Eosinophilia, unspecified: Secondary | ICD-10-CM | POA: Diagnosis not present

## 2024-03-13 DIAGNOSIS — E559 Vitamin D deficiency, unspecified: Secondary | ICD-10-CM | POA: Diagnosis not present

## 2024-03-13 NOTE — Assessment & Plan Note (Addendum)
-   Etiology felt to be due to smoking, seasonal allergies and idiopathic uticaria.  -Physical exam does not reveal any palpable lymphadenopathy or splenomegaly. - Mutational testing NEGATIVE for JAK2 V617F, CALR, MPL, Exons 12-15, and BCR-ABL  - Most recent CBC/D show platelet count of 602. -Continue to monitor every 6 months.

## 2024-03-13 NOTE — Progress Notes (Signed)
 [facility] OFFICE PROGRESS NOTE  Del Wilhelmena Falter, Hilario, FNP  ASSESSMENT & PLAN:   Assessment & Plan Thrombocytosis - Etiology felt to be due to smoking, seasonal allergies and idiopathic uticaria.  -Physical exam does not reveal any palpable lymphadenopathy or splenomegaly. - Mutational testing NEGATIVE for JAK2 V617F, CALR, MPL, Exons 12-15, and BCR-ABL  - Most recent CBC/D show platelet count of 602. -Continue to monitor every 6 months. Other eosinophilia - Most recent labs  WBC 12.2/eosinophils 1.4.  Normal LDH. - Overall findings suggest secondary eosinophilia from chronic urticaria and other allergic type symptoms (rather than primary eosinophilia the cause of the symptoms).  No strong evidence to suggest clonal eosinophilia or MPN at this time. -Patient was recently treated for a sinus infection along with skin cellulitis. - Recommend continued follow-up with allergist.  Patient may consider referral to pulmonologist or gastroenterologist, depending on symptoms.  - We will continue surveillance with repeat CBC/D and office visit in 6 months  Vitamin D  deficiency Vitamin D  level from July 2025 was low. Recommend she continue vitamin D  supplements.  Orders Placed This Encounter  Procedures   CBC with Differential/Platelet    Standing Status:   Future    Expected Date:   09/10/2024    Expiration Date:   03/13/2025   Lactate dehydrogenase    Standing Status:   Future    Expected Date:   09/10/2024    Expiration Date:   03/13/2025    INTERVAL HISTORY: Patient returns for follow-up for thrombocytosis and eosinophilia.  Since her last visit, she was seen in urgent care for a cat bite that turned into cellulitis.  She was given antibiotics with improvement.  Reports antibiotics did cause some GI upset and she lost a little bit of weight.  Symptoms have fully resolved.  She denies any hospitalizations, surgeries or changes to her baseline health.  Reports she  started taking vitamin D  supplements with improvement of her energy levels.  We reviewed CBC, CMP and LDH.  SUMMARY OF HEMATOLOGIC HISTORY: Oncology History   No history exists.    1.   Thrombocytosis:  - Longstanding history of thrombocytosis with platelet count mostly between 500s and 600s, present since at least 2015.   - Mutational testing NEGATIVE for JAK2 V617F, CALR, MPL, Exons 12-15, and BCR-ABL  - No history of DVT, PE, MI, CVA.   No connective tissue disorder.  Rheumatoid factor and ANA negative.  ESR and CRP were normal. - No aquagenic pruritus or vasomotor symptoms.  No prior history of thrombosis. - She has been diagnosed with idiopathic urticaria and chronic allergies.   - Bone marrow biopsy (02/07/2023): Normocellular bone marrow for age with eosinophilic hyperplasia.  Abundant megakaryocytes with scattered large forms.  No significant dyspoiesis or increase in blastic cells.  Overall findings considered nonspecific and nondiagnostic of myeloid neoplasm.  Suspected to be secondary in nature, especially in the absence of clonal abnormalities. - DIFFERENTIAL DIAGNOSIS favors reactive thrombocytosis and leukocytosis from cigarette smoking, steroid use, and allergic disorder  2.  Eosinophilia - She has had intermittent leukocytosis since 2017. - Persistent elevations in eosinophils since at least 2019 (no prior differentials available).  Neutrophils, lymphocytes, and monocytes have been normal. - No signs of end organ involvement on imaging.  (Reviewed CT CAP from 03/19/2022 which showed small benign lymph nodes in the bilateral axillary regions.  No pathological lymphadenopathy.  No lung nodules.  No splenomegaly.) - Follows with [facility] (Dr. Frutoso) for  idiopathic urticaria, allergic rhinitis, and atopic dermatitis. - No history of asthma, but imaging findings consistent with COPD. - Skin biopsy (04/16/2022) showed perivascular dermatitis with eosinophils (moderately dense  inflammatory infiltrate in the dermis primarily in the perivascular distribution; infiltrate contains lymphocytes, eosinophils and occasional histiocytes; differential diagnosis from histological standpoint includes florid urticaria, papular urticaria, drug reaction, insect bite reaction, or other arthropod bites. - She uses Kenalog cream for rash. - She does not have any B symptoms or vasomotor symptoms.   - She denies any pulmonary or GI symptoms  - Eosinophilia workup (11/12/2022):  No mutations of CHIC2, PDGFRA, PDGFRB, or FGFR1 observed. Flow cytometry shows predominance of T cells with nonspecific changes, but no monoclonal B-cell population identified Mild elevation of vitamin B12 at 1077 (denies any current B12 supplementation) Normal IgE levels, troponin, tryptase, ESR, CRP, EBV DNA, ANA. - Bone marrow biopsy (02/07/2023): Normocellular bone marrow for age with trilineage hematopoiesis, but with increased number of eosinophilic cells.  Significant dyspoiesis or increase in blastic cells is not identified.  Overall findings are not considered specific or diagnostic of a myeloid neoplasm and may be secondary in nature especially in the absence of clonal abnormalities. - Cytogenetics showed normal female karyotype 46,XX[20]  CBC    Component Value Date/Time   WBC 12.2 (H) 02/19/2024 0824   RBC 4.07 02/19/2024 0824   HGB 13.4 02/19/2024 0824   HGB 13.5 12/19/2020 1639   HCT 39.2 02/19/2024 0824   HCT 41.2 12/19/2020 1639   PLT 602 (H) 02/19/2024 0824   PLT 583 (H) 12/19/2020 1639   MCV 96.3 02/19/2024 0824   MCV 101 (H) 12/19/2020 1639   MCH 32.9 02/19/2024 0824   MCHC 34.2 02/19/2024 0824   RDW 12.8 02/19/2024 0824   RDW 12.0 12/19/2020 1639   LYMPHSABS 3.7 02/19/2024 0824   MONOABS 0.7 02/19/2024 0824   EOSABS 1.4 (H) 02/19/2024 0824   BASOSABS 0.1 02/19/2024 0824       Latest Ref Rng & Units 01/24/2024    8:02 AM 03/01/2023    8:23 AM 10/29/2022    2:55 PM  CMP  Glucose 70  - 99 mg/dL 86  86  897   BUN 6 - 24 mg/dL 7  6  10    Creatinine 0.57 - 1.00 mg/dL 9.41  9.41  9.21   Sodium 134 - 144 mmol/L 138  139  132   Potassium 3.5 - 5.2 mmol/L 4.4  5.1  3.8   Chloride 96 - 106 mmol/L 101  100  98   CO2 20 - 29 mmol/L 21  20  24    Calcium  8.7 - 10.2 mg/dL 9.8  9.9  9.1   Total Protein 6.0 - 8.5 g/dL  7.1  6.9   Total Bilirubin 0.0 - 1.2 mg/dL  0.3  0.5   Alkaline Phos 44 - 121 IU/L  72  60   AST 0 - 40 IU/L  33  24   ALT 0 - 32 IU/L  20  21      Lab Results  Component Value Date   FERRITIN 45 05/28/2018   VITAMINB12 664 01/24/2024    Vitals:   03/13/24 0955  BP: 135/79  Pulse: 72  Resp: 18  Temp: 98 F (36.7 C)  SpO2: 96%    Review of System:  Review of Systems  Constitutional:  Positive for malaise/fatigue and weight loss.    Physical Exam: Physical Exam Constitutional:      Appearance: Normal appearance.  HENT:     Head: Normocephalic and atraumatic.  Eyes:     Pupils: Pupils are equal, round, and reactive to light.  Cardiovascular:     Rate and Rhythm: Normal rate and regular rhythm.     Heart sounds: Normal heart sounds. No murmur heard. Pulmonary:     Effort: Pulmonary effort is normal.     Breath sounds: Normal breath sounds. No wheezing.  Abdominal:     General: Bowel sounds are normal. There is no distension.     Palpations: Abdomen is soft.     Tenderness: There is no abdominal tenderness.  Musculoskeletal:        General: Normal range of motion.     Cervical back: Normal range of motion.  Skin:    General: Skin is warm and dry.     Findings: No rash.  Neurological:     Mental Status: She is alert and oriented to person, place, and time.     Gait: Gait is intact.  Psychiatric:        Mood and Affect: Mood and affect normal.        Cognition and Memory: Memory normal.        Judgment: Judgment normal.      I spent 20 minutes dedicated to the care of this patient (face-to-face and non-face-to-face) on the date of  the encounter to include what is described in the assessment and plan.,  Delon Hope, NP 03/13/2024 10:14 AM

## 2024-03-13 NOTE — Assessment & Plan Note (Addendum)
 Vitamin D  level from July 2025 was low. Recommend she continue vitamin D  supplements.

## 2024-03-13 NOTE — Assessment & Plan Note (Addendum)
-   Most recent labs  WBC 12.2/eosinophils 1.4.  Normal LDH. - Overall findings suggest secondary eosinophilia from chronic urticaria and other allergic type symptoms (rather than primary eosinophilia the cause of the symptoms).  No strong evidence to suggest clonal eosinophilia or MPN at this time. -Patient was recently treated for a sinus infection along with skin cellulitis. - Recommend continued follow-up with allergist.  Patient may consider referral to pulmonologist or gastroenterologist, depending on symptoms.  - We will continue surveillance with repeat CBC/D and office visit in 6 months

## 2024-04-27 DIAGNOSIS — L820 Inflamed seborrheic keratosis: Secondary | ICD-10-CM | POA: Diagnosis not present

## 2024-04-27 DIAGNOSIS — Z1283 Encounter for screening for malignant neoplasm of skin: Secondary | ICD-10-CM | POA: Diagnosis not present

## 2024-04-27 DIAGNOSIS — D225 Melanocytic nevi of trunk: Secondary | ICD-10-CM | POA: Diagnosis not present

## 2024-04-27 DIAGNOSIS — L508 Other urticaria: Secondary | ICD-10-CM | POA: Diagnosis not present

## 2024-04-27 DIAGNOSIS — L821 Other seborrheic keratosis: Secondary | ICD-10-CM | POA: Diagnosis not present

## 2024-05-14 ENCOUNTER — Ambulatory Visit: Admitting: Adult Health

## 2024-07-13 ENCOUNTER — Other Ambulatory Visit: Payer: Self-pay | Admitting: Adult Health

## 2024-07-13 DIAGNOSIS — Z1231 Encounter for screening mammogram for malignant neoplasm of breast: Secondary | ICD-10-CM

## 2024-07-16 ENCOUNTER — Ambulatory Visit: Admitting: Nurse Practitioner

## 2024-07-28 ENCOUNTER — Ambulatory Visit

## 2024-07-29 ENCOUNTER — Encounter: Payer: Self-pay | Admitting: Adult Health

## 2024-07-29 ENCOUNTER — Ambulatory Visit: Admitting: Adult Health

## 2024-07-29 VITALS — BP 129/76 | HR 75 | Ht 60.0 in | Wt 88.0 lb

## 2024-07-29 DIAGNOSIS — Z1331 Encounter for screening for depression: Secondary | ICD-10-CM

## 2024-07-29 DIAGNOSIS — Z780 Asymptomatic menopausal state: Secondary | ICD-10-CM | POA: Diagnosis not present

## 2024-07-29 DIAGNOSIS — Z01419 Encounter for gynecological examination (general) (routine) without abnormal findings: Secondary | ICD-10-CM | POA: Diagnosis not present

## 2024-07-29 NOTE — Progress Notes (Signed)
 Patient ID: Sherri Jackson, female   DOB: 11-19-1965, 59 y.o.   MRN: [id number] History of Present Illness: Sherri Jackson is a 59 year old white female, married, PM in for a well woman gyn exam.      Component Value Date/Time   DIAGPAP  03/28/2023 0848    - Negative for intraepithelial lesion or malignancy (NILM)   DIAGPAP  11/17/2019 9171    - Negative for intraepithelial lesion or malignancy (NILM)   DIAGPAP  04/08/2017 0000    NEGATIVE FOR INTRAEPITHELIAL LESIONS OR MALIGNANCY.   HPVHIGH Negative 03/28/2023 0848   HPVHIGH Negative 11/17/2019 0828   ADEQPAP  03/28/2023 0848    Satisfactory for evaluation; transformation zone component ABSENT.   ADEQPAP  11/17/2019 0828    Satisfactory for evaluation; transformation zone component ABSENT.   ADEQPAP  04/08/2017 0000    Satisfactory for evaluation  endocervical/transformation zone component ABSENT.    PCP is I Polanco NP  Current Medications, Allergies, Past Medical History, Past Surgical History, Family History and Social History were reviewed in [employer] record.     Review of Systems: Patient denies any headaches, hearing loss, fatigue, blurred vision, shortness of breath, chest pain, abdominal pain, problems with bowel movements, urination, or intercourse. No joint pain or mood swings.  Denies any vaginal bleeding  Did notice pain in left ear on Monday but not as bad now  Physical Exam:BP 129/76 (BP Location: Left Arm, Patient Position: Sitting, Cuff Size: Normal)   Pulse 75   Ht 5' (1.524 m)   Wt 88 lb (39.9 kg)   LMP 10/01/2013 Comment: bleeding 09/2016  BMI 17.19 kg/m   General:  Well developed, well nourished, no acute distress Skin:  Warm and dry Neck:  Midline trachea, normal thyroid , good ROM, no lymphadenopathy, ears are clear bilaterally  Lungs; Clear to auscultation bilaterally Breast:  No dominant palpable mass, retraction, or nipple discharge Cardiovascular: Regular rate and rhythm Abdomen:   Soft, non tender, no hepatosplenomegaly Pelvic:  External genitalia is normal in appearance, no lesions.  The vagina is pale. Urethra has no lesions or masses. The cervix is smooth..  Uterus is felt to be normal size, shape, and contour.  No adnexal masses or tenderness noted.Bladder is non tender, no masses felt. Rectal: Deferred Extremities/musculoskeletal:  No swelling or varicosities noted, no clubbing or cyanosis Psych:  No mood changes, alert and cooperative,seems happy AA is 1  Fall risk is low    07/29/2024    1:31 PM 03/13/2024    9:52 AM 03/04/2024    1:15 PM  Depression screen PHQ 2/9  Decreased Interest 0 0 0  Down, Depressed, Hopeless 0 0 0  PHQ - 2 Score 0 0 0  Altered sleeping 0 0 0  Tired, decreased energy 0 0 0  Change in appetite 0 0 0  Feeling bad or failure about yourself  0 0 0  Trouble concentrating 0 0 0  Moving slowly or fidgety/restless 0 0 0  Suicidal thoughts 0 0 0  PHQ-9 Score 0 0  0   Difficult doing work/chores   Not difficult at all     Data saved with a previous flowsheet row definition       07/29/2024    1:32 PM 03/04/2024    1:15 PM 01/16/2024    8:02 AM 12/12/2023    2:44 PM  GAD 7 : Generalized Anxiety Score  Nervous, Anxious, on Edge 1 0  1  0   Control/stop  worrying 1 0  1  0   Worry too much - different things 1 0  1  0   Trouble relaxing 1 1  1   0   Restless 0 0  1  0   Easily annoyed or irritable 1 0  1  0   Afraid - awful might happen 0 0  1  0   Total GAD 7 Score 5 1 7  0  Anxiety Difficulty  Not difficult at all Not difficult at all Not difficult at all     Data saved with a previous flowsheet row definition      Upstream - 07/29/24 1341       Pregnancy Intention Screening   Does the patient want to become pregnant in the next year? N/A    Does the patient's partner want to become pregnant in the next year? N/A    Would the patient like to discuss contraceptive options today? N/A      Contraception Wrap Up   Current Method  Female Sterilization;Post-Menopause    End Method Female Sterilization;Post-Menopause    Contraception Counseling Provided No         Co exam with Lotus Minerva NP student  Examination chaperoned by Clarita Salt LPN   Impression and plan: 1. Encounter for well woman exam with routine gynecological exam (Primary) Pap and physical in 1 year Mammogram scheduled 08/07/24, had mammogram 07/30/23 had calcifications and follow up 08/03/23 with biopsy 08/09/23 Colonoscopy per GI Labs with PCP  2. Postmenopausal Denies any vaginal bleeding

## 2024-08-03 ENCOUNTER — Emergency Department (HOSPITAL_COMMUNITY): Admission: EM | Admit: 2024-08-03 | Discharge: 2024-08-03 | Disposition: A | Discharge status: Home / Self Care

## 2024-08-03 ENCOUNTER — Other Ambulatory Visit: Payer: Self-pay

## 2024-08-03 ENCOUNTER — Encounter (HOSPITAL_COMMUNITY): Payer: Self-pay

## 2024-08-03 ENCOUNTER — Emergency Department (HOSPITAL_COMMUNITY)

## 2024-08-03 DIAGNOSIS — S52502A Unspecified fracture of the lower end of left radius, initial encounter for closed fracture: Secondary | ICD-10-CM | POA: Insufficient documentation

## 2024-08-03 DIAGNOSIS — M25532 Pain in left wrist: Secondary | ICD-10-CM | POA: Insufficient documentation

## 2024-08-03 DIAGNOSIS — W000XXA Fall on same level due to ice and snow, initial encounter: Secondary | ICD-10-CM | POA: Insufficient documentation

## 2024-08-03 DIAGNOSIS — Z91040 Latex allergy status: Secondary | ICD-10-CM | POA: Insufficient documentation

## 2024-08-03 MED ORDER — HYDROCODONE-ACETAMINOPHEN 5-325 MG PO TABS: ORAL_TABLET | ORAL | 0 refills | Status: AC

## 2024-08-03 NOTE — Discharge Instructions (Signed)
 Elevate your hand when possible.  Avoid use of your left hand.  Call one of the orthopedic providers listed to arrange follow-up appointment.

## 2024-08-03 NOTE — ED Triage Notes (Signed)
 Patient arrives POV from home c/c of a fall. She fell Saturday morning. She thinks she fell backwards and possibly caught herself with her L arm/hand but doesn't remember clearly. Currently wearing a wrist brace on her L wrist. Denies LOC or head injury. Bruising noted to forearm from wrist to elbow. No deformity noted. Denies changes to PMS.

## 2024-08-03 NOTE — ED Provider Notes (Signed)
 " [place] EMERGENCY DEPARTMENT AT [facility] Provider Note   CSN: [id number] Arrival date & time: 08/03/24  9065     Patient presents with: Wrist Pain   Sherri Jackson is a 59 y.o. female.    Wrist Pain        Sherri Jackson is a 59 y.o. female who presents to the Emergency Department complaining of left wrist pain and swelling from a mechanical fall that occurred on Saturday.  She states that she slipped and fell on the ice.  She fell backwards but believes that she caught her left hand and wrist.  She she has been wearing a brace with some relief.  Continues to have pain associated with movement of her wrist.  She denies any numbness of her fingers, elbow pain shoulder pain head injury or LOC.  She is right-hand dominant.   Prior to Admission medications  Medication Sig Start Date End Date Taking? Authorizing Provider  HYDROcodone -acetaminophen  (NORCO/VICODIN) 5-325 MG tablet Take one tab po q 4 hrs prn pain 08/03/24  Yes Oakes Mccready, PA-C  amLODipine  (NORVASC ) 2.5 MG tablet Take 1 tablet (2.5 mg total) by mouth daily. 01/16/24   Del Orbe Polanco, Iliana, FNP  azelastine  (ASTELIN ) 0.1 % nasal spray Place 1 spray into both nostrils 2 (two) times daily. Use in each nostril as directed 02/22/24   Stuart Vernell Norris, PA-C  cetirizine  (ZYRTEC  ALLERGY) 10 MG tablet Take 1 tablet (10 mg total) by mouth daily. 02/22/24   Stuart Vernell Norris, PA-C  chlorhexidine  (HIBICLENS ) 4 % external liquid Apply topically daily as needed. 02/22/24   Stuart Vernell Norris, PA-C  clobetasol (TEMOVATE) 0.05 % external solution Apply 1 Application topically 2 (two) times daily.    [provider]  hydrOXYzine (ATARAX) 25 MG tablet Take 25-50 mg by mouth at bedtime. 09/22/22   [provider]  LORazepam  (ATIVAN ) 0.5 MG tablet Take 1 tablet (0.5 mg total) by mouth once as needed for up to 1 dose for anxiety. 01/16/24   Del Orbe Polanco, Iliana, FNP  Multiple Vitamin  (MULTIVITAMIN) tablet Take 1 tablet by mouth daily.    [provider]  rosuvastatin  (CRESTOR ) 5 MG tablet Take 1 tablet (5 mg total) by mouth daily. 01/29/24   Del Wilhelmena Lloyd Sola, FNP  VITAMIN D , ERGOCALCIFEROL , PO     [provider]    Allergies: Latex    Review of Systems  Musculoskeletal:  Positive for arthralgias (Left wrist pain).  All other systems reviewed and are negative.   Updated Vital Signs BP 135/82 (BP Location: Right Arm)   Pulse 88   Temp 98.1 F (36.7 C) (Oral)   Resp 19   Ht 5' (1.524 m)   Wt 39.9 kg   LMP 10/01/2013 Comment: bleeding 09/2016  SpO2 99%   BMI 17.19 kg/m   Physical Exam Vitals and nursing note reviewed.  Constitutional:      General: She is not in acute distress.    Appearance: Normal appearance. She is not toxic-appearing.  HENT:     Head: Atraumatic.  Cardiovascular:     Rate and Rhythm: Normal rate and regular rhythm.     Pulses: Normal pulses.  Pulmonary:     Effort: Pulmonary effort is normal.     Breath sounds: Normal breath sounds.  Musculoskeletal:        General: Swelling, tenderness and signs of injury present.     Left wrist: Swelling, tenderness and bony tenderness present. No  crepitus. Decreased range of motion. Normal pulse.     Cervical back: Normal range of motion. No tenderness.     Comments: Tenderness to palpation distal left wrist.  Mild edema noted.  Some ecchymosis to the distal wrist.  No tenderness of the dorsal hand or fingers.  She has full range of motion of the fingers, elbow is nontender.  Compartments of the forearm are soft  Skin:    General: Skin is warm.     Capillary Refill: Capillary refill takes less than 2 seconds.  Neurological:     General: No focal deficit present.     Mental Status: She is alert.     Sensory: No sensory deficit.     Motor: No weakness.     (all labs ordered are listed, but only abnormal results are displayed) Labs Reviewed - No data to  display  EKG: None  Radiology: DG Wrist Complete Left Result Date: 08/03/2024 EXAM: 3 OR MORE VIEW(S) XRAY OF THE LEFT WRIST 08/03/2024 10:23:00 AM COMPARISON: None available. CLINICAL HISTORY: Fall. FINDINGS: BONES AND JOINTS: Nondisplaced transverse distal radial fracture with intra-articular extension to the radiocarpal joint. Nondisplaced ulnar styloid fracture. No malalignment. SOFT TISSUES: Soft tissue swelling of the wrist. IMPRESSION: 1. Nondisplaced transverse distal radial fracture with intra-articular extension to the radiocarpal joint. 2. Nondisplaced ulnar styloid fracture. Electronically signed by: Rogelia Myers MD 08/03/2024 10:38 AM EST RP Workstation: [id number]     Procedures     Medications Ordered in the ED - No data to display                                  Medical Decision Making   Patient here with left wrist pain secondary to mechanical fall that occurred 2 days ago.  Fell on ice.  Does not believe that she fell on outstretched hand.  No head injury or LOC.  She does not take blood thinners.  She is right-hand dominant  On exam, there is tenderness along the distal left wrist.  She has good cap refill and good range of motion of her fingers of the left hand.  I do not appreciate any bony deformities, there is mild edema noted.  Clinically I suspect there is fracture, sprain also considered as well as dislocation  Amount and/or Complexity of Data Reviewed Radiology: ordered.    Details: X-rays of the left wrist showed nondisplaced transverse distal radius fracture with intra-articular extension into the radiocarpal joint.  There is a nondisplaced ulnar styloid fracture Discussion of management or test interpretation with external provider(s):  Patient prefers orthopedic follow-up in [place].  She will be placed in sugar-tong splint and sling given for comfort.  Short course of pain medication also provided database reviewed.  I have checked splint  application, extremity remains neurovascularly intact.  She has good cap refill of the fingers.  Follow-up information given for orthopedics.  She appears appropriate for discharge home, all questions were answered  Risk Prescription drug management.        Final diagnoses:  Closed fracture of distal end of left radius, unspecified fracture morphology, initial encounter    ED Discharge Orders          Ordered    HYDROcodone -acetaminophen  (NORCO/VICODIN) 5-325 MG tablet        08/03/24 1147               Herlinda Milling, PA-C 08/03/24 1156  "

## 2024-08-04 ENCOUNTER — Ambulatory Visit: Admitting: Orthopedic Surgery

## 2024-08-04 ENCOUNTER — Encounter: Payer: Self-pay | Admitting: Orthopedic Surgery

## 2024-08-04 VITALS — BP 135/82 | Ht 60.0 in | Wt 88.0 lb

## 2024-08-04 DIAGNOSIS — S52572A Other intraarticular fracture of lower end of left radius, initial encounter for closed fracture: Secondary | ICD-10-CM

## 2024-08-04 NOTE — Patient Instructions (Signed)
 General Cast Instructions  1.  You were placed in a cast in clinic today.  Please keep the cast material clean, dry and intact.  Please do not use anything to itch the under the cast.  If it gets itchy, you can consider taking benadryl, or similar medication.  If the cast material gets wet, place it on a towel and use a hair dryer on a low setting. 2.  Tylenol  or Ibuprofen/Naproxen  as needed.   3.  Recommend elevating your extremity as much as possible to help with swelling. 4.  F/u 1 weeks, repeat XR

## 2024-08-04 NOTE — Progress Notes (Signed)
 New Patient Visit  Summary: Sherri Jackson is a 59 y.o. female with the following: Left distal radius fracture; intra-articular without noticeable step-off  Assessment and Plan Assessment & Plan Closed fracture of left distal radius Minimally displaced, impacted closed distal radius fracture with mild articular depression and preserved alignment. Nonoperative management appropriate given current alignment, though risk for displacement or subsidence remains. - Removed bulky splint and applied short arm cast to left forearm. - Ordered repeat radiograph in one week to assess alignment. - Scheduled follow-up in two weeks to evaluate healing and discuss cast removal. - Instructed to elevate arm to reduce edema and perform finger range of motion as tolerated. - Advised to avoid lifting or heavy use of left arm for 4-6 weeks; light desk work permitted. - Discussed anticipated course: cast immobilization for 4-6 weeks, followed by brace for 2-4 weeks with gradual restoration of motion and strength. - Provided reassurance regarding expected swelling and ecchymosis. - Offered work notes or documentation for employer as needed.   Cast application - Left short arm cast   Verbal consent was obtained and the correct extremity was identified. A well padded, appropriately molded short arm cast was applied to the Left arm Fingers remained warm and well perfused.   There were no sharp edges Patient tolerated the procedure well Cast care instructions were provided    Follow-up: Return in about 1 week (around 08/11/2024) for Follow with Dr. Margrette for repeat XR.  Subjective:  Chief Complaint  Patient presents with   Wrist Injury    Left 08/02/23 fell / ER on 08/03/24     Discussed the use of AI scribe software for clinical note transcription with the patient, who gave verbal consent to proceed.  History of Present Illness Sherri Jackson is a 59 year old female who presents for  evaluation of a closed left distal radius fracture following a fall.  She fell on Saturday and injured her left wrist, with no prior injury to this wrist. She wrapped it, applied ice, and used a right-hand brace adapted for the left wrist at home.  She was seen in the emergency department on Monday. Radiographs showed a closed distal radius fracture and a splint was placed. She finds the splint extremely uncomfortable, bulky, and difficult for activities like dressing. She notes swelling and ecchymosis of the left wrist. She can move her fingers without significant pain or functional limitation. She cannot lift objects with the left hand and relies on the right hand for most tasks.  Pain has been managed with ibuprofen, then naproxen  started Sunday from a prior prescription, without need for additional analgesics. She can type and use her phone but expects decreased productivity as she depends on her right hand. She is on leave from work as a lexicographer and plans to return next week for [place] [employer] duties with reduced productivity due to the injury.    Review of Systems: No fevers or chills No numbness or tingling No chest pain No shortness of breath No bowel or bladder dysfunction No GI distress No headaches   Medical History:  Past Medical History:  Diagnosis Date   Elevated MCV 03/20/2016   Will check B12 and folate   Enlarged thyroid  12/01/2013   Hyperlipidemia    Hypertension    Peri-menopausal 12/01/2013   Vaginal Pap smear, abnormal    Vitamin D  deficiency 03/20/2016    Past Surgical History:  Procedure Laterality Date   ANKLE SURGERY Left    APPENDECTOMY  BONE MARROW BIOPSY  01/2023   BREAST BIOPSY Right 08/09/2023   MM RT BREAST BX W LOC DEV 1ST LESION IMAGE BX SPEC STEREO GUIDE 08/09/2023 [facility]   HYSTEROSCOPY W/ ENDOMETRIAL ABLATION  07/02/2002   laser conization of cervix  07/02/2002    Family History  Problem Relation Age of Onset   Cancer  Father        lung   COPD Father    Emphysema Father    Hyperlipidemia Father    Hypertension Father    Thyroid  disease Father    Bone cancer Brother    Diabetes Paternal Uncle    Heart disease Paternal Grandmother    Heart attack Paternal Grandfather    Breast cancer Cousin    Breast cancer Cousin    Social History[1]  Allergies[2]  Active Medications[3]  Objective: BP 135/82 Comment: 08/03/24  Ht 5' (1.524 m)   Wt 88 lb (39.9 kg)   LMP 10/01/2013 Comment: bleeding 09/2016  BMI 17.19 kg/m   Physical Exam:    General: Alert and oriented. and No acute distress. Gait: Normal gait.  Physical Exam EXTREMITIES: After removal of the splint, there is no skin breakdown.  There is diffuse swelling and ecchymosis.  Fingers warm and well-perfused.  Sensation is intact throughout the left hand.  Active motion intact in the AIN/PIN/U nerve distribution.   IMAGING: I personally reviewed images previously obtained from the ED  X-rays from the emergency department are available in clinic today.  Intra-articular split, with minimal to no vertical displacement.  Fracture line extends to the radial side of the distal radius.  Some mild comminution.  No intra-articular displacement or step-off     New Medications:  No orders of the defined types were placed in this encounter.     Portions of this note were completed via Scientist, clinical (histocompatibility and immunogenetics).  Oneil DELENA Horde, MD  08/04/2024 1:35 PM      [1]  Social History Tobacco Use   Smoking status: Every Day    Current packs/day: 0.50    Average packs/day: 0.5 packs/day for 33.0 years (16.5 ttl pk-yrs)    Types: Cigarettes   Smokeless tobacco: Never  Vaping Use   Vaping status: Never Used  Substance Use Topics   Alcohol use: Yes    Alcohol/week: 1.0 standard drink of alcohol    Types: 1 Glasses of wine per week    Comment: 1 glass of wine a day   Drug use: No  [2]  Allergies Allergen Reactions   Latex Rash  [3]   Current Meds  Medication Sig   amLODipine  (NORVASC ) 2.5 MG tablet Take 1 tablet (2.5 mg total) by mouth daily.   azelastine  (ASTELIN ) 0.1 % nasal spray Place 1 spray into both nostrils 2 (two) times daily. Use in each nostril as directed   cetirizine  (ZYRTEC  ALLERGY) 10 MG tablet Take 1 tablet (10 mg total) by mouth daily.   chlorhexidine  (HIBICLENS ) 4 % external liquid Apply topically daily as needed.   clobetasol (TEMOVATE) 0.05 % external solution Apply 1 Application topically 2 (two) times daily.   HYDROcodone -acetaminophen  (NORCO/VICODIN) 5-325 MG tablet Take one tab po q 4 hrs prn pain   hydrOXYzine (ATARAX) 25 MG tablet Take 25-50 mg by mouth at bedtime.   LORazepam  (ATIVAN ) 0.5 MG tablet Take 1 tablet (0.5 mg total) by mouth once as needed for up to 1 dose for anxiety.   Multiple Vitamin (MULTIVITAMIN) tablet Take 1 tablet by mouth daily.  rosuvastatin  (CRESTOR ) 5 MG tablet Take 1 tablet (5 mg total) by mouth daily.   VITAMIN D , ERGOCALCIFEROL , PO

## 2024-08-05 ENCOUNTER — Ambulatory Visit: Payer: Self-pay | Admitting: Nurse Practitioner

## 2024-08-05 DIAGNOSIS — S52502D Unspecified fracture of the lower end of left radius, subsequent encounter for closed fracture with routine healing: Secondary | ICD-10-CM | POA: Insufficient documentation

## 2024-08-07 ENCOUNTER — Ambulatory Visit

## 2024-08-10 ENCOUNTER — Encounter: Admitting: Orthopedic Surgery

## 2024-08-10 DIAGNOSIS — S52572D Other intraarticular fracture of lower end of left radius, subsequent encounter for closed fracture with routine healing: Secondary | ICD-10-CM

## 2024-09-02 ENCOUNTER — Inpatient Hospital Stay

## 2024-09-09 ENCOUNTER — Inpatient Hospital Stay: Admitting: Physician Assistant

## 2024-09-17 ENCOUNTER — Ambulatory Visit: Admitting: Internal Medicine

## 2024-09-23 ENCOUNTER — Ambulatory Visit
